# Patient Record
Sex: Female | Born: 1970 | Race: Asian | Hispanic: No | Marital: Married | Smoking: Never smoker
Health system: Southern US, Community
[De-identification: ages and names within clinical notes are randomized; demographics above are authoritative.]

## PROBLEM LIST (undated history)

## (undated) DIAGNOSIS — R350 Frequency of micturition: Secondary | ICD-10-CM

## (undated) DIAGNOSIS — F419 Anxiety disorder, unspecified: Secondary | ICD-10-CM

## (undated) DIAGNOSIS — R109 Unspecified abdominal pain: Secondary | ICD-10-CM

## (undated) DIAGNOSIS — J3089 Other allergic rhinitis: Secondary | ICD-10-CM

## (undated) DIAGNOSIS — D649 Anemia, unspecified: Secondary | ICD-10-CM

## (undated) DIAGNOSIS — N939 Abnormal uterine and vaginal bleeding, unspecified: Secondary | ICD-10-CM

---

## 2014-09-17 ENCOUNTER — Ambulatory Visit: Payer: Self-pay | Admitting: Internal Medicine

## 2015-10-10 ENCOUNTER — Other Ambulatory Visit: Payer: Self-pay | Admitting: Family Medicine

## 2015-10-10 DIAGNOSIS — N63 Unspecified lump in unspecified breast: Secondary | ICD-10-CM

## 2015-10-18 ENCOUNTER — Ambulatory Visit
Admission: RE | Admit: 2015-10-18 | Discharge: 2015-10-18 | Disposition: A | Discharge status: Home / Self Care | Payer: BLUE CROSS/BLUE SHIELD | Source: Ambulatory Visit | Attending: Family Medicine | Admitting: Family Medicine

## 2015-10-18 DIAGNOSIS — N63 Unspecified lump in unspecified breast: Secondary | ICD-10-CM

## 2015-10-22 ENCOUNTER — Other Ambulatory Visit: Payer: Self-pay | Admitting: Family Medicine

## 2015-10-22 DIAGNOSIS — N63 Unspecified lump in unspecified breast: Secondary | ICD-10-CM

## 2015-10-29 ENCOUNTER — Ambulatory Visit
Admission: RE | Admit: 2015-10-29 | Discharge: 2015-10-29 | Disposition: A | Discharge status: Home / Self Care | Payer: BLUE CROSS/BLUE SHIELD | Source: Ambulatory Visit | Attending: Family Medicine | Admitting: Family Medicine

## 2015-10-29 DIAGNOSIS — N6032 Fibrosclerosis of left breast: Secondary | ICD-10-CM | POA: Diagnosis not present

## 2015-10-29 DIAGNOSIS — N63 Unspecified lump in unspecified breast: Secondary | ICD-10-CM

## 2015-10-29 HISTORY — PX: BREAST BIOPSY: SHX20

## 2015-10-30 LAB — SURGICAL PATHOLOGY

## 2016-05-19 ENCOUNTER — Other Ambulatory Visit: Payer: Self-pay | Admitting: Obstetrics and Gynecology

## 2016-05-19 DIAGNOSIS — N6320 Unspecified lump in the left breast, unspecified quadrant: Secondary | ICD-10-CM

## 2016-06-02 ENCOUNTER — Ambulatory Visit
Admission: RE | Admit: 2016-06-02 | Discharge: 2016-06-02 | Disposition: A | Discharge status: Home / Self Care | Payer: BLUE CROSS/BLUE SHIELD | Source: Ambulatory Visit | Attending: Obstetrics and Gynecology | Admitting: Obstetrics and Gynecology

## 2016-06-02 DIAGNOSIS — N6320 Unspecified lump in the left breast, unspecified quadrant: Secondary | ICD-10-CM | POA: Insufficient documentation

## 2016-09-02 ENCOUNTER — Other Ambulatory Visit: Payer: Self-pay | Admitting: Internal Medicine

## 2016-09-02 DIAGNOSIS — Z1231 Encounter for screening mammogram for malignant neoplasm of breast: Secondary | ICD-10-CM

## 2016-11-02 ENCOUNTER — Ambulatory Visit
Admission: RE | Admit: 2016-11-02 | Discharge: 2016-11-02 | Disposition: A | Discharge status: Home / Self Care | Payer: BLUE CROSS/BLUE SHIELD | Source: Ambulatory Visit | Attending: Internal Medicine | Admitting: Internal Medicine

## 2016-11-02 DIAGNOSIS — Z1231 Encounter for screening mammogram for malignant neoplasm of breast: Secondary | ICD-10-CM | POA: Insufficient documentation

## 2016-11-04 ENCOUNTER — Other Ambulatory Visit: Payer: Self-pay | Admitting: Internal Medicine

## 2016-11-04 DIAGNOSIS — N6310 Unspecified lump in the right breast, unspecified quadrant: Secondary | ICD-10-CM

## 2016-11-04 DIAGNOSIS — R928 Other abnormal and inconclusive findings on diagnostic imaging of breast: Secondary | ICD-10-CM

## 2016-11-19 ENCOUNTER — Ambulatory Visit
Admission: RE | Admit: 2016-11-19 | Discharge: 2016-11-19 | Disposition: A | Discharge status: Home / Self Care | Payer: BLUE CROSS/BLUE SHIELD | Source: Ambulatory Visit | Attending: Internal Medicine | Admitting: Internal Medicine

## 2016-11-19 DIAGNOSIS — R928 Other abnormal and inconclusive findings on diagnostic imaging of breast: Secondary | ICD-10-CM | POA: Diagnosis present

## 2016-11-19 DIAGNOSIS — N6001 Solitary cyst of right breast: Secondary | ICD-10-CM | POA: Insufficient documentation

## 2016-11-19 DIAGNOSIS — N6310 Unspecified lump in the right breast, unspecified quadrant: Secondary | ICD-10-CM

## 2018-05-30 ENCOUNTER — Encounter
Admission: RE | Admit: 2018-05-30 | Discharge: 2018-05-30 | Disposition: A | Discharge status: Home / Self Care | Payer: BLUE CROSS/BLUE SHIELD | Source: Ambulatory Visit | Attending: Obstetrics & Gynecology | Admitting: Obstetrics & Gynecology

## 2018-05-30 ENCOUNTER — Other Ambulatory Visit: Payer: Self-pay

## 2018-05-30 DIAGNOSIS — Z01812 Encounter for preprocedural laboratory examination: Secondary | ICD-10-CM | POA: Diagnosis present

## 2018-05-30 HISTORY — DX: Anemia, unspecified: D64.9

## 2018-05-30 HISTORY — DX: Frequency of micturition: R35.0

## 2018-05-30 HISTORY — DX: Other allergic rhinitis: J30.89

## 2018-05-30 HISTORY — DX: Abnormal uterine and vaginal bleeding, unspecified: N93.9

## 2018-05-30 HISTORY — DX: Anxiety disorder, unspecified: F41.9

## 2018-05-30 HISTORY — DX: Unspecified abdominal pain: R10.9

## 2018-05-30 LAB — BASIC METABOLIC PANEL
Anion gap: 8 (ref 5–15)
BUN: 9 mg/dL (ref 6–20)
CALCIUM: 9.2 mg/dL (ref 8.9–10.3)
CO2: 24 mmol/L (ref 22–32)
CREATININE: 0.57 mg/dL (ref 0.44–1.00)
Chloride: 103 mmol/L (ref 98–111)
GFR calc Af Amer: >60 mL/min (ref >60)
GFR calc non Af Amer: >60 mL/min (ref >60)
Glucose, Bld: 97 mg/dL (ref 70–99)
Potassium: 3.5 mmol/L (ref 3.5–5.1)
Sodium: 135 mmol/L (ref 135–145)

## 2018-05-30 LAB — CBC
HEMATOCRIT: 41.5 % (ref 36.0–46.0)
Hemoglobin: 12.7 g/dL (ref 12.0–15.0)
MCH: 24.2 pg — ABNORMAL LOW (ref 26.0–34.0)
MCHC: 30.6 g/dL (ref 30.0–36.0)
MCV: 79 fL — AB (ref 80.0–100.0)
Platelets: 307 10*3/uL (ref 150–400)
RBC: 5.25 MIL/uL — ABNORMAL HIGH (ref 3.87–5.11)
RDW: 17.6 % — AB (ref 11.5–15.5)
WBC: 4.2 10*3/uL (ref 4.0–10.5)
nRBC: 0 % (ref 0.0–0.2)

## 2018-05-30 LAB — TYPE AND SCREEN
ABO/RH(D): B POS
Antibody Screen: NEGATIVE

## 2018-05-30 NOTE — Patient Instructions (Signed)
Your procedure is scheduled on: 06/03/18 Fri  Report to Same Day Surgery 2nd floor medical mall [facility] Entrance-take elevator on left to 2nd floor.  Check in with surgery information desk.) To find out your arrival time please call [phone] between 1PM - 3PM on 06/02/18 Thur Remember: Instructions that are not followed completely may result in serious medical risk, up to and including death, or upon the discretion of your surgeon and anesthesiologist your surgery may need to be rescheduled.    _x___ 1. Do not eat food after midnight the night before your procedure. You may drink clear liquids up to 2 hours before you are scheduled to arrive at the hospital for your procedure.  Do not drink clear liquids within 2 hours of your scheduled arrival to the hospital.  Clear liquids include  --Water or Apple juice without pulp  --Clear carbohydrate beverage such as ClearFast or Gatorade  --Black Coffee or Clear Tea (No milk, no creamers, do not add anything to                  the coffee or Tea Type 1 and type 2 diabetics should only drink water.   ____Ensure clear carbohydrate drink on the way to the hospital for bariatric patients  ____Ensure clear carbohydrate drink 3 hours before surgery for Dr Dwyane Luo patients if physician instructed.   No gum chewing or hard candies.     __x__ 2. No Alcohol for 24 hours before or after surgery.   __x__3. No Smoking or e-cigarettes for 24 prior to surgery.  Do not use any chewable tobacco products for at least 6 hour prior to surgery   ____  4. Bring all medications with you on the day of surgery if instructed.    __x__ 5. Notify your doctor if there is any change in your medical condition     (cold, fever, infections).    x___6. On the morning of surgery brush your teeth with toothpaste and water.  You may rinse your mouth with mouth wash if you wish.  Do not swallow any toothpaste or mouthwash.   Do not wear jewelry, make-up, hairpins,  clips or nail polish.  Do not wear lotions, powders, or perfumes. You may wear deodorant.  Do not shave 48 hours prior to surgery. Men may shave face and neck.  Do not bring valuables to the hospital.    [facility] is not responsible for any belongings or valuables.               Contacts, dentures or bridgework may not be worn into surgery.  Leave your suitcase in the car. After surgery it may be brought to your room.  For patients admitted to the hospital, discharge time is determined by your                       treatment team.  _  Patients discharged the day of surgery will not be allowed to drive home.  You will need someone to drive you home and stay with you the night of your procedure.    Please read over the following fact sheets that you were given:   [facility] Preparing for Surgery and or MRSA Information   _x___ Take anti-hypertensive listed below, cardiac, seizure, asthma,     anti-reflux and psychiatric medicines. These include:  1. levocetirizine (XYZAL) 5 MG tablet if needed  2.  3.  4.  5.  6.  ____Fleets  enema or Magnesium Citrate as directed.   _x___ Use CHG Soap or sage wipes as directed on instruction sheet   ____ Use inhalers on the day of surgery and bring to hospital day of surgery  ____ Stop Metformin and Janumet 2 days prior to surgery.    ____ Take 1/2 of usual insulin dose the night before surgery and none on the morning     surgery.   _x___ Follow recommendations from Cardiologist, Pulmonologist or PCP regarding          stopping Aspirin, Coumadin, Plavix ,Eliquis, Effient, or Pradaxa, and Pletal.  X____Stop Anti-inflammatories such as Advil, Aleve, Ibuprofen, Motrin, Naproxen, Naprosyn, Goodies powders or aspirin products. OK to take Tylenol and                          Celebrex.   _x___ Stop supplements until after surgery.  But may continue Vitamin D, Vitamin B,       and multivitamin.   ____ Bring C-Pap to the hospital.

## 2018-06-02 MED ORDER — CEFAZOLIN SODIUM-DEXTROSE 2-4 GM/100ML-% IV SOLN
2.0000 g | Freq: Once | INTRAVENOUS | Status: AC
  Administered 2018-06-03: 2 g via INTRAVENOUS

## 2018-06-03 ENCOUNTER — Ambulatory Visit: Payer: BLUE CROSS/BLUE SHIELD | Admitting: Anesthesiology

## 2018-06-03 ENCOUNTER — Ambulatory Visit
Admission: RE | Admit: 2018-06-03 | Discharge: 2018-06-03 | Disposition: A | Discharge status: Home / Self Care | Payer: BLUE CROSS/BLUE SHIELD | Source: Ambulatory Visit | Attending: Obstetrics & Gynecology | Admitting: Obstetrics & Gynecology

## 2018-06-03 ENCOUNTER — Other Ambulatory Visit: Payer: Self-pay

## 2018-06-03 ENCOUNTER — Encounter: Payer: Self-pay | Admitting: *Deleted

## 2018-06-03 ENCOUNTER — Encounter
Admission: RE | Disposition: A | Discharge status: Home / Self Care | Payer: Self-pay | Source: Ambulatory Visit | Attending: Obstetrics & Gynecology

## 2018-06-03 DIAGNOSIS — D259 Leiomyoma of uterus, unspecified: Secondary | ICD-10-CM

## 2018-06-03 DIAGNOSIS — J302 Other seasonal allergic rhinitis: Secondary | ICD-10-CM | POA: Diagnosis not present

## 2018-06-03 DIAGNOSIS — N800 Endometriosis of uterus: Secondary | ICD-10-CM | POA: Insufficient documentation

## 2018-06-03 DIAGNOSIS — N838 Other noninflammatory disorders of ovary, fallopian tube and broad ligament: Secondary | ICD-10-CM | POA: Insufficient documentation

## 2018-06-03 DIAGNOSIS — N939 Abnormal uterine and vaginal bleeding, unspecified: Secondary | ICD-10-CM | POA: Diagnosis not present

## 2018-06-03 DIAGNOSIS — D251 Intramural leiomyoma of uterus: Secondary | ICD-10-CM | POA: Diagnosis not present

## 2018-06-03 DIAGNOSIS — N888 Other specified noninflammatory disorders of cervix uteri: Secondary | ICD-10-CM | POA: Diagnosis not present

## 2018-06-03 DIAGNOSIS — K668 Other specified disorders of peritoneum: Secondary | ICD-10-CM | POA: Diagnosis not present

## 2018-06-03 HISTORY — PX: LAPAROSCOPIC HYSTERECTOMY: SHX1926

## 2018-06-03 HISTORY — PX: LAPAROSCOPIC BILATERAL SALPINGECTOMY: SHX5889

## 2018-06-03 LAB — ABO/RH: ABO/RH(D): B POS

## 2018-06-03 LAB — POCT PREGNANCY, URINE: Preg Test, Ur: NEGATIVE

## 2018-06-03 SURGERY — HYSTERECTOMY, TOTAL, LAPAROSCOPIC
Anesthesia: General

## 2018-06-03 MED ORDER — SUGAMMADEX SODIUM 200 MG/2ML IV SOLN
INTRAVENOUS | Status: DC | PRN
  Administered 2018-06-03: 150 mg via INTRAVENOUS

## 2018-06-03 MED ORDER — CEFAZOLIN SODIUM-DEXTROSE 2-4 GM/100ML-% IV SOLN
INTRAVENOUS | Status: AC
  Filled 2018-06-03: qty 100

## 2018-06-03 MED ORDER — ACETAMINOPHEN 500 MG PO TABS
1000.0000 mg | ORAL_TABLET | Freq: Once | ORAL | Status: AC
  Administered 2018-06-03: 1000 mg via ORAL

## 2018-06-03 MED ORDER — MIDAZOLAM HCL 2 MG/2ML IJ SOLN
INTRAMUSCULAR | Status: DC | PRN
  Administered 2018-06-03: 2 mg via INTRAVENOUS

## 2018-06-03 MED ORDER — LACTATED RINGERS IV SOLN
INTRAVENOUS | Status: DC | PRN
  Administered 2018-06-03: 13:00:00 via INTRAVENOUS

## 2018-06-03 MED ORDER — MIDAZOLAM HCL 2 MG/2ML IJ SOLN
INTRAMUSCULAR | Status: AC
  Filled 2018-06-03: qty 2

## 2018-06-03 MED ORDER — ROCURONIUM BROMIDE 100 MG/10ML IV SOLN
INTRAVENOUS | Status: DC | PRN
  Administered 2018-06-03: 20 mg via INTRAVENOUS

## 2018-06-03 MED ORDER — ACETAMINOPHEN 650 MG RE SUPP: 650.0000 mg | RECTAL | Status: DC | PRN

## 2018-06-03 MED ORDER — PROPOFOL 10 MG/ML IV BOLUS
INTRAVENOUS | Status: AC
  Filled 2018-06-03: qty 20

## 2018-06-03 MED ORDER — MORPHINE SULFATE (PF) 4 MG/ML IV SOLN: 1.0000 mg | INTRAVENOUS | Status: DC | PRN

## 2018-06-03 MED ORDER — IBUPROFEN 800 MG PO TABS: 800.0000 mg | ORAL_TABLET | Freq: Four times a day (QID) | ORAL | 1 refills | Status: AC

## 2018-06-03 MED ORDER — DEXAMETHASONE SODIUM PHOSPHATE 10 MG/ML IJ SOLN
INTRAMUSCULAR | Status: DC | PRN
  Administered 2018-06-03: 10 mg via INTRAVENOUS

## 2018-06-03 MED ORDER — CELECOXIB 200 MG PO CAPS
400.0000 mg | ORAL_CAPSULE | Freq: Once | ORAL | Status: AC
  Administered 2018-06-03: 400 mg via ORAL

## 2018-06-03 MED ORDER — BUPIVACAINE HCL (PF) 0.5 % IJ SOLN
INTRAMUSCULAR | Status: AC
  Filled 2018-06-03: qty 30

## 2018-06-03 MED ORDER — ACETAMINOPHEN 500 MG PO TABS
ORAL_TABLET | ORAL | Status: AC
  Administered 2018-06-03: 1000 mg via ORAL
  Filled 2018-06-03: qty 2

## 2018-06-03 MED ORDER — LACTATED RINGERS IV SOLN
INTRAVENOUS | Status: DC
  Administered 2018-06-03: 13:00:00 via INTRAVENOUS

## 2018-06-03 MED ORDER — ONDANSETRON HCL 4 MG/2ML IJ SOLN: 4.0000 mg | Freq: Once | INTRAMUSCULAR | Status: DC | PRN

## 2018-06-03 MED ORDER — OXYCODONE HCL 5 MG PO TABS: 5.0000 mg | ORAL_TABLET | Freq: Three times a day (TID) | ORAL | 0 refills | Status: AC | PRN

## 2018-06-03 MED ORDER — FENTANYL CITRATE (PF) 100 MCG/2ML IJ SOLN
INTRAMUSCULAR | Status: DC | PRN
  Administered 2018-06-03 (×2): 50 ug via INTRAVENOUS

## 2018-06-03 MED ORDER — HEPARIN SODIUM (PORCINE) 5000 UNIT/ML IJ SOLN
INTRAMUSCULAR | Status: AC
  Administered 2018-06-03: 5000 [IU] via SUBCUTANEOUS
  Filled 2018-06-03: qty 1

## 2018-06-03 MED ORDER — CELECOXIB 200 MG PO CAPS
ORAL_CAPSULE | ORAL | Status: AC
  Administered 2018-06-03: 400 mg via ORAL
  Filled 2018-06-03: qty 2

## 2018-06-03 MED ORDER — ACETAMINOPHEN 325 MG PO TABS: 650.0000 mg | ORAL_TABLET | ORAL | Status: DC | PRN

## 2018-06-03 MED ORDER — FAMOTIDINE 20 MG PO TABS
ORAL_TABLET | ORAL | Status: AC
  Administered 2018-06-03: 20 mg via ORAL
  Filled 2018-06-03: qty 1

## 2018-06-03 MED ORDER — FENTANYL CITRATE (PF) 100 MCG/2ML IJ SOLN: 25.0000 ug | INTRAMUSCULAR | Status: DC | PRN

## 2018-06-03 MED ORDER — EPHEDRINE SULFATE 50 MG/ML IJ SOLN
INTRAMUSCULAR | Status: DC | PRN
  Administered 2018-06-03: 10 mg via INTRAVENOUS

## 2018-06-03 MED ORDER — GABAPENTIN 300 MG PO CAPS
600.0000 mg | ORAL_CAPSULE | Freq: Once | ORAL | Status: AC
  Administered 2018-06-03: 600 mg via ORAL

## 2018-06-03 MED ORDER — KETOROLAC TROMETHAMINE 30 MG/ML IJ SOLN: 30.0000 mg | Freq: Four times a day (QID) | INTRAMUSCULAR | Status: DC

## 2018-06-03 MED ORDER — FAMOTIDINE 20 MG PO TABS
20.0000 mg | ORAL_TABLET | Freq: Once | ORAL | Status: AC
  Administered 2018-06-03: 20 mg via ORAL

## 2018-06-03 MED ORDER — GABAPENTIN 300 MG PO CAPS
ORAL_CAPSULE | ORAL | Status: AC
  Administered 2018-06-03: 600 mg via ORAL
  Filled 2018-06-03: qty 2

## 2018-06-03 MED ORDER — HEPARIN SODIUM (PORCINE) 5000 UNIT/ML IJ SOLN
5000.0000 [IU] | Freq: Once | INTRAMUSCULAR | Status: AC
  Administered 2018-06-03: 5000 [IU] via SUBCUTANEOUS

## 2018-06-03 MED ORDER — LACTATED RINGERS IV SOLN: INTRAVENOUS | Status: DC

## 2018-06-03 MED ORDER — FENTANYL CITRATE (PF) 100 MCG/2ML IJ SOLN
INTRAMUSCULAR | Status: AC
  Filled 2018-06-03: qty 2

## 2018-06-03 MED ORDER — ONDANSETRON HCL 4 MG/2ML IJ SOLN
INTRAMUSCULAR | Status: DC | PRN
  Administered 2018-06-03: 4 mg via INTRAVENOUS

## 2018-06-03 SURGICAL SUPPLY — 57 items
BACTOSHIELD CHG 4% 4OZ (MISCELLANEOUS) ×1
BAG URINE DRAINAGE (UROLOGICAL SUPPLIES) ×4 IMPLANT
BLADE SURG SZ11 CARB STEEL (BLADE) ×4 IMPLANT
CANISTER SUCT 1200ML W/VALVE (MISCELLANEOUS) ×4 IMPLANT
CATH FOLEY 2WAY  5CC 16FR (CATHETERS) ×2
CATH URTH 16FR FL 2W BLN LF (CATHETERS) ×2 IMPLANT
CHLORAPREP W/TINT 26ML (MISCELLANEOUS) ×8 IMPLANT
COUNTER NEEDLE 20/40 LG (NEEDLE) ×4 IMPLANT
COVER WAND RF STERILE (DRAPES) ×4 IMPLANT
DEFOGGER SCOPE WARMER CLEARIFY (MISCELLANEOUS) ×4 IMPLANT
DERMABOND ADVANCED (GAUZE/BANDAGES/DRESSINGS) ×2
DERMABOND ADVANCED .7 DNX12 (GAUZE/BANDAGES/DRESSINGS) ×2 IMPLANT
DEVICE SUTURE ENDOST 10MM (ENDOMECHANICALS) IMPLANT
DRAPE LEGGINS SURG 28X43 STRL (DRAPES) ×4 IMPLANT
DRAPE SHEET LG 3/4 BI-LAMINATE (DRAPES) ×4 IMPLANT
DRAPE UNDER BUTTOCK W/FLU (DRAPES) ×4 IMPLANT
ELECT REM PT RETURN 9FT ADLT (ELECTROSURGICAL) ×4
ELECTRODE REM PT RTRN 9FT ADLT (ELECTROSURGICAL) ×2 IMPLANT
GLOVE PI ORTHOPRO 6.5 (GLOVE) ×2
GLOVE PI ORTHOPRO STRL 6.5 (GLOVE) ×2 IMPLANT
GLOVE SURG SYN 6.5 ES PF (GLOVE) ×24 IMPLANT
GOWN STRL REUS W/ TWL LRG LVL3 (GOWN DISPOSABLE) ×6 IMPLANT
GOWN STRL REUS W/ TWL XL LVL3 (GOWN DISPOSABLE) ×2 IMPLANT
GOWN STRL REUS W/TWL LRG LVL3 (GOWN DISPOSABLE) ×6
GOWN STRL REUS W/TWL XL LVL3 (GOWN DISPOSABLE) ×2
IRRIGATION STRYKERFLOW (MISCELLANEOUS) IMPLANT
IRRIGATOR STRYKERFLOW (MISCELLANEOUS)
IV LACTATED RINGERS 1000ML (IV SOLUTION) ×4 IMPLANT
KIT PINK PAD W/HEAD ARE REST (MISCELLANEOUS) ×4
KIT PINK PAD W/HEAD ARM REST (MISCELLANEOUS) ×2 IMPLANT
KIT TURNOVER CYSTO (KITS) ×4 IMPLANT
L-HOOK LAP DISP 36CM (ELECTROSURGICAL) ×4
LABEL OR SOLS (LABEL) IMPLANT
LHOOK LAP DISP 36CM (ELECTROSURGICAL) ×2 IMPLANT
LIGASURE VESSEL 5MM BLUNT TIP (ELECTROSURGICAL) ×4 IMPLANT
MANIPULATOR VCARE LG CRV RETR (MISCELLANEOUS) ×4 IMPLANT
MANIPULATOR VCARE SML CRV RETR (MISCELLANEOUS) IMPLANT
MANIPULATOR VCARE STD CRV RETR (MISCELLANEOUS) IMPLANT
NS IRRIG 500ML POUR BTL (IV SOLUTION) ×4 IMPLANT
PACK LAP CHOLECYSTECTOMY (MISCELLANEOUS) ×4 IMPLANT
PAD OB MATERNITY 4.3X12.25 (PERSONAL CARE ITEMS) ×4 IMPLANT
PAD PREP 24X41 OB/GYN DISP (PERSONAL CARE ITEMS) ×4 IMPLANT
PENCIL ELECTRO HAND CTR (MISCELLANEOUS) ×4 IMPLANT
SCRUB CHG 4% DYNA-HEX 4OZ (MISCELLANEOUS) ×3 IMPLANT
SET CYSTO W/LG BORE CLAMP LF (SET/KITS/TRAYS/PACK) IMPLANT
SET YANKAUER POOLE SUCT (MISCELLANEOUS) ×8 IMPLANT
SLEEVE ENDOPATH XCEL 5M (ENDOMECHANICALS) ×12 IMPLANT
SURGILUBE 2OZ TUBE FLIPTOP (MISCELLANEOUS) ×4 IMPLANT
SUT ENDO VLOC 180-0-8IN (SUTURE) IMPLANT
SUT MNCRL 4-0 (SUTURE) ×2
SUT MNCRL 4-0 27XMFL (SUTURE) ×2
SUT MNCRL AB 4-0 PS2 18 (SUTURE) IMPLANT
SUT VIC AB 0 CT1 36 (SUTURE) ×8 IMPLANT
SUTURE MNCRL 4-0 27XMF (SUTURE) ×2 IMPLANT
TROCAR XCEL NON-BLD 5MMX100MML (ENDOMECHANICALS) ×4 IMPLANT
TUBING INSUF HEATED (TUBING) ×4 IMPLANT
TUBING INSUFFLATION (TUBING) ×4 IMPLANT

## 2018-06-03 NOTE — Anesthesia Post-op Follow-up Note (Signed)
Anesthesia QCDR form completed.        

## 2018-06-03 NOTE — Anesthesia Procedure Notes (Signed)
Procedure Name: Intubation Date/Time: 06/03/2018 1:24 PM Performed by: Timoteo Expose, CRNA Pre-anesthesia Checklist: Patient identified, Emergency Drugs available, Suction available, Patient being monitored and Timeout performed Patient Re-evaluated:Patient Re-evaluated prior to induction Oxygen Delivery Method: Circle system utilized Preoxygenation: Pre-oxygenation with 100% oxygen Induction Type: IV induction Ventilation: Mask ventilation without difficulty Laryngoscope Size: Mac and 3 Grade View: Grade I Tube type: Oral Tube size: 6.5 mm Number of attempts: 1 Airway Equipment and Method: Stylet Placement Confirmation: ETT inserted through vocal cords under direct vision Secured at: 21 cm Tube secured with: Tape Dental Injury: Teeth and Oropharynx as per pre-operative assessment  Future Recommendations: Recommend- induction with short-acting agent, and alternative techniques readily available

## 2018-06-03 NOTE — Anesthesia Preprocedure Evaluation (Signed)
Anesthesia Evaluation  Patient identified by MRN, date of birth, ID band Patient awake    Reviewed: Allergy & Precautions, H&P , NPO status , Patient's Chart, lab work & pertinent test results, reviewed documented beta blocker date and time   Airway Mallampati: II  TM Distance: >3 FB Neck ROM: full    Dental  (+) Teeth Intact   Pulmonary neg pulmonary ROS,    Pulmonary exam normal        Cardiovascular Exercise Tolerance: Good negative cardio ROS Normal cardiovascular exam Rhythm:regular Rate:Normal     Neuro/Psych Anxiety negative neurological ROS  negative psych ROS   GI/Hepatic negative GI ROS, Neg liver ROS,   Endo/Other  negative endocrine ROS  Renal/GU negative Renal ROS  negative genitourinary   Musculoskeletal   Abdominal   Peds  Hematology  (+) Blood dyscrasia, anemia ,   Anesthesia Other Findings Past Medical History: No date: Abdominal pain No date: Abnormal uterine bleeding No date: Anemia No date: Anxiety No date: Environmental and seasonal allergies No date: Urinary frequency Past Surgical History: 10/29/2015: BREAST BIOPSY; Left     Comment:  FOAMY MACROPHAGES, ACUTE AND CHRONIC DUCTAL               INFLAMMATION, AND REACTIVE    Reproductive/Obstetrics negative OB ROS                             Anesthesia Physical Anesthesia Plan  ASA: II  Anesthesia Plan: General ETT   Post-op Pain Management:    Induction:   PONV Risk Score and Plan:   Airway Management Planned:   Additional Equipment:   Intra-op Plan:   Post-operative Plan:   Informed Consent: I have reviewed the patients History and Physical, chart, labs and discussed the procedure including the risks, benefits and alternatives for the proposed anesthesia with the patient or authorized representative who has indicated his/her understanding and acceptance.   Dental Advisory Given  Plan Discussed  with: CRNA  Anesthesia Plan Comments:         Anesthesia Quick Evaluation

## 2018-06-03 NOTE — Op Note (Signed)
Total Laparoscopic Hysterectomy Operative Note Procedure Date: 06/03/2018  Patient:  Stephanie Goodman  47 y.o. female  PRE-OPERATIVE DIAGNOSIS:  Symptomatic fibroid uterus, abnormal bleeding  POST-OPERATIVE DIAGNOSIS:same, pelvic inclusion cyst  PROCEDURE:  Procedure(s): HYSTERECTOMY TOTAL LAPAROSCOPIC (N/A) LAPAROSCOPIC BILATERAL SALPINGECTOMY (Bilateral)  SURGEON:  Surgeon(s) and Role:    * Carinne Brandenburger, Honor Loh, MD - Primary    * Benjaman Kindler, MD - Assisting  ANESTHESIA:  General via ET  I/O  Total I/O In: 700 [I.V.:700] Out: 750 [Urine:700; Blood:50]  FINDINGS:  Large smooth intramural fibroid uterus, normal ovaries bilaterally and normal left fallopian tube.  Normal upper abdomen.  Free-floating cystic structure in pelvic posterior culdesac  SPECIMEN: Uterus, Cervix, and left fallopian tube, inclusion cyst  COMPLICATIONS: none apparent  DISPOSITION: vital signs stable to PACU  Indication for Surgery: 47 y.o. with known fibroid uterus and increasing symptoms, requesting definitive management for her constellation of symptoms.  Risks of surgery were discussed with the patient including but not limited to: bleeding which may require transfusion or reoperation; infection which may require antibiotics; injury to bowel, bladder, ureters or other surrounding organs; need for additional procedures including laparotomy, blood clot, incisional problems and other postoperative/anesthesia complications. Written informed consent was obtained.      PROCEDURE IN DETAIL:  The patient had 5000u Heparin Sub-q and sequential compression devices applied to her lower extremities while in the preoperative area.  She was then taken to the operating room. IV antibiotics were given. General anesthesia was administered via endotracheal route.  She was placed in the dorsal lithotomy position, and was prepped and draped in a sterile manner. A surgical time-out was performed.  A Foley catheter was inserted into  her bladder and attached to constant drainage and a V-Care uterine manipulator was then advanced into the uterus and a good fit around the cervix was noted. The gloves were changed, and attention was turned to the abdomen where an umbilical incision was made with the scalpel.  A 1mm trochar was inserted in the umbilical incision using a visiport method.Opening pressure was 84mmHg, and the abdomen was insufflated to 68mmHg carbon dioxide gas and adequate pneumoperitoneum was obtained. A survey of the patient's pelvis and abdomen revealed the findings as mentioned above. Two 55mm ports were inserted in the lower left and right quadrants under visualization.    The right fallopian tube was absent.  The left fallopian tube was separated from the mesosalpinx using the Ligasure. The bilateral round ligaments and utero-ovarian ligaments and vessels were transected and anterior broad ligament divided and brought across the uterus to separate the vesicouterine peritoneum and create a bladder flap. The bladder was pushed away from the uterus. The bilateral uterine arteries were skeletonized, ligated and transected. The bilateral uterosacral and cardinal ligaments were ligated and transected. A colpotomy was made around the V-Care cervical cup and the uterus, cervix, and bilateral tubes were removed through the vagina.The incidental inclusion cyst found in the pelvic was also removed. The vaginal cuff was closed vaginally using 0-Vicryl in a running locking stitch. This was tested for integrity using the surgeon's finger. After a change of gloves, the pneumoperitoneum was recreated and surgical site inspected, and found to be hemostatic. Bilateral ureters were visualized vermiuclating. No intraoperative injury to surrounding organs was noted. The abdomen was desufflated and all instruments were then removed.   All skin incisions were closed with 4-0 monocryl and covered with surgical glue. The patient tolerated  the procedures well.  All instruments, needles, and sponge  counts were correct x 2. The patient was taken to the recovery room in stable condition.   ---- Larey Days, MD Attending Obstetrician and [facility]

## 2018-06-03 NOTE — Discharge Instructions (Addendum)
Discharge instructions:  Call office if you have any of the following: fever >101 F, chills, shortness of breath, excessive vaginal bleeding, incision drainage or problems, leg pain or redness, or any other concerns.   Activity: Do not lift > 15 lbs for 8 weeks.  No intercourse or tampons for 8 weeks.  No driving for 1-2 weeks, or until you are certain you can slam on the brakes.   You may feel some pain in your upper right abdomen/rib and right shoulder.  This is from the gas in the abdomen for surgery. This will subside over time, please be patient!  Take 800mg  Ibuprofen and 1000mg  Tylenol around the clock, every 6 hours for at least the first 3-5 days.  After this you can take as needed.  This will help decrease inflammation and promote healing.  The narcotics you'll take just as needed, as they just trick your brain into thinking its not in pain.    Please don't limit yourself in terms of routine activity.  You will be able to do most things, although they may take longer to do or be a little painful.  You can do it!  Don't be a hero, but don't be a wimp either!     AMBULATORY SURGERY  DISCHARGE INSTRUCTIONS   1) The drugs that you were given will stay in your system until tomorrow so for the next 24 hours you should not:  A) Drive an automobile B) Make any legal decisions C) Drink any alcoholic beverage   2) You may resume regular meals tomorrow.  Today it is better to start with liquids and gradually work up to solid foods.  You may eat anything you prefer, but it is better to start with liquids, then soup and crackers, and gradually work up to solid foods.   3) Please notify your doctor immediately if you have any unusual bleeding, trouble breathing, redness and pain at the surgery site, drainage, fever, or pain not relieved by medication.    4) Additional Instructions:        Please contact your physician with any problems or Same Day Surgery at [phone],  Monday through Friday 6 am to 4 pm, or [place] at [facility] number at [phone].

## 2018-06-03 NOTE — Transfer of Care (Signed)
Immediate Anesthesia Transfer of Care Note  Patient: Stephanie Goodman  Procedure(s) Performed: HYSTERECTOMY TOTAL LAPAROSCOPIC (N/A ) LAPAROSCOPIC BILATERAL SALPINGECTOMY (Bilateral )  Patient Location: PACU  Anesthesia Type:General  Level of Consciousness: awake  Airway & Oxygen Therapy: Patient Spontanous Breathing  Post-op Assessment: Report given to RN  Post vital signs: Reviewed  Last Vitals:  Vitals Value Taken Time  BP 111/79 06/03/2018  2:55 PM  Temp    Pulse 81 06/03/2018  2:57 PM  Resp 19 06/03/2018  2:57 PM  SpO2 100 % 06/03/2018  2:57 PM  Vitals shown include unvalidated device data.  Last Pain:  Vitals:   06/03/18 1202  TempSrc: Tympanic  PainSc: 2       Patients Stated Pain Goal: 2 (37/36/68 1594)  Complications: No apparent anesthesia complications

## 2018-06-03 NOTE — H&P (Signed)
Preoperative History and Physical  Stephanie Goodman is a 47 y.o.here for surgical management of symptomatic fibroid uterus.  Has pelvic pressure and urinary frequency due to mass effect, as well as history of anemia secondary to heavy menses.   No significant preoperative concerns.  Proposed surgery: total laparoscopic hysterectomy, bilateral salpingectomy  Past Medical History:  Diagnosis Date  . Abdominal pain   . Abnormal uterine bleeding   . Anemia   . Anxiety   . Environmental and seasonal allergies   . Urinary frequency    Past Surgical History:  Procedure Laterality Date  . BREAST BIOPSY Left 10/29/2015   FOAMY MACROPHAGES, ACUTE AND CHRONIC DUCTAL INFLAMMATION, AND REACTIVE    OB History  No obstetric history on file.  Patient denies any other pertinent gynecologic issues.   No current facility-administered medications on file prior to encounter.    Current Outpatient Medications on File Prior to Encounter  Medication Sig Dispense Refill  . levocetirizine (XYZAL) 5 MG tablet Take 5 mg by mouth at bedtime as needed.   4   No Known Allergies  Social History:   reports that she has never smoked. She has never used smokeless tobacco. She reports that she does not drink alcohol or use drugs.  Family History  Problem Relation Age of Onset  . Breast cancer Neg Hx     Review of Systems: Noncontributory  PHYSICAL EXAM: Blood pressure 104/75, pulse 74, temperature (!) 97.3 F (36.3 C), temperature source Tympanic, resp. rate 16, last menstrual period 05/26/2018, SpO2 100 %. General appearance - alert, well appearing, and in no distress Chest - clear to auscultation, no wheezes, rales or rhonchi, symmetric air entry Heart - normal rate and regular rhythm Abdomen - soft, nontender, nondistended, no masses or organomegaly Pelvic - examination not indicated Extremities - peripheral pulses normal, no pedal edema, no clubbing or cyanosis  Labs: Results for orders placed or performed  during the hospital encounter of 06/03/18 (from the past 336 hour(s))  Pregnancy, urine POC   Collection Time: 06/03/18 12:07 PM  Result Value Ref Range   Preg Test, Ur NEGATIVE NEGATIVE  Results for orders placed or performed during the hospital encounter of 05/30/18 (from the past 336 hour(s))  CBC   Collection Time: 05/30/18  1:59 PM  Result Value Ref Range   WBC 4.2 4.0 - 10.5 K/uL   RBC 5.25 (H) 3.87 - 5.11 MIL/uL   Hemoglobin 12.7 12.0 - 15.0 g/dL   HCT 41.5 36.0 - 46.0 %   MCV 79.0 (L) 80.0 - 100.0 fL   MCH 24.2 (L) 26.0 - 34.0 pg   MCHC 30.6 30.0 - 36.0 g/dL   RDW 17.6 (H) 11.5 - 15.5 %   Platelets 307 150 - 400 K/uL   nRBC 0.0 0.0 - 0.2 %  Basic metabolic panel   Collection Time: 05/30/18  1:59 PM  Result Value Ref Range   Sodium 135 135 - 145 mmol/L   Potassium 3.5 3.5 - 5.1 mmol/L   Chloride 103 98 - 111 mmol/L   CO2 24 22 - 32 mmol/L   Glucose, Bld 97 70 - 99 mg/dL   BUN 9 6 - 20 mg/dL   Creatinine, Ser 0.57 0.44 - 1.00 mg/dL   Calcium 9.2 8.9 - 10.3 mg/dL   GFR calc non Af Amer >60 >60 mL/min   GFR calc Af Amer >60 >60 mL/min   Anion gap 8 5 - 15  Type and screen [place]  Collection Time: 05/30/18  1:59 PM  Result Value Ref Range   ABO/RH(D) B POS    Antibody Screen NEG    Sample Expiration 06/13/2018    Extend sample reason      NO TRANSFUSIONS OR PREGNANCY IN THE PAST 3 MONTHS Performed at [facility], [address] [place],  [address]     Imaging Studies: done in office 10 x 9 x 9cm uterus with prominent 7cm fibroid  Assessment: Patient Active Problem List   Diagnosis Date Noted  . Fibroid uterus 06/03/2018    Plan: Patient will undergo surgical management with [facility] BS.  The risks of surgery were discussed in detail with the patient including but not limited to: bleeding which may require transfusion or reoperation; infection which may require antibiotics; injury to surrounding organs which may involve  bowel, bladder, ureters ; need for additional procedures including laparoscopy or laparotomy; thromboembolic phenomenon, surgical site problems and other postoperative/anesthesia complications. Likelihood of success in alleviating the patient's condition was discussed. Routine postoperative instructions will be reviewed with the patient and her family in detail after surgery.  The patient concurred with the proposed plan, giving informed written consent for the surgery.  Patient has been NPO since last night she will remain NPO for procedure.  Anesthesia and OR aware.  Preoperative prophylactic antibiotics and SCDs ordered on call to the OR.  To OR when ready.  ----- Larey Days, MD Attending Obstetrician and Gynecologist [facility], Department of [facility]

## 2018-06-05 ENCOUNTER — Encounter: Payer: Self-pay | Admitting: Obstetrics & Gynecology

## 2018-06-06 NOTE — Anesthesia Postprocedure Evaluation (Signed)
Anesthesia Post Note  Patient: Stephanie Goodman  Procedure(s) Performed: HYSTERECTOMY TOTAL LAPAROSCOPIC (N/A ) LAPAROSCOPIC BILATERAL SALPINGECTOMY (Bilateral )  Patient location during evaluation: PACU Anesthesia Type: General Level of consciousness: awake and alert Pain management: pain level controlled Vital Signs Assessment: post-procedure vital signs reviewed and stable Respiratory status: spontaneous breathing, nonlabored ventilation, respiratory function stable and patient connected to nasal cannula oxygen Cardiovascular status: blood pressure returned to baseline and stable Postop Assessment: no apparent nausea or vomiting Anesthetic complications: no     Last Vitals:  Vitals:   06/03/18 1603 06/03/18 1714  BP: 118/70 114/73  Pulse: 78 83  Resp: 16 16  Temp: 36.9 C   SpO2: 100% 100%    Last Pain:  Vitals:   06/03/18 1714  TempSrc:   PainSc: 4                  Molli Barrows

## 2018-06-07 LAB — SURGICAL PATHOLOGY

## 2019-05-02 ENCOUNTER — Other Ambulatory Visit: Payer: Self-pay | Admitting: Internal Medicine

## 2019-05-02 DIAGNOSIS — N63 Unspecified lump in unspecified breast: Secondary | ICD-10-CM

## 2019-05-11 ENCOUNTER — Other Ambulatory Visit: Payer: BLUE CROSS/BLUE SHIELD

## 2019-05-22 ENCOUNTER — Ambulatory Visit
Admission: RE | Admit: 2019-05-22 | Discharge: 2019-05-22 | Disposition: A | Discharge status: Home / Self Care | Payer: BLUE CROSS/BLUE SHIELD | Source: Ambulatory Visit | Attending: Internal Medicine | Admitting: Internal Medicine

## 2019-05-22 ENCOUNTER — Encounter: Payer: Self-pay | Admitting: Radiology

## 2019-05-22 DIAGNOSIS — N63 Unspecified lump in unspecified breast: Secondary | ICD-10-CM | POA: Diagnosis present

## 2019-06-08 ENCOUNTER — Other Ambulatory Visit: Payer: Self-pay

## 2020-04-04 ENCOUNTER — Other Ambulatory Visit: Payer: Self-pay | Admitting: Physician Assistant

## 2020-04-04 DIAGNOSIS — D171 Benign lipomatous neoplasm of skin and subcutaneous tissue of trunk: Secondary | ICD-10-CM

## 2020-04-10 ENCOUNTER — Other Ambulatory Visit: Payer: Self-pay

## 2020-04-10 ENCOUNTER — Encounter (INDEPENDENT_AMBULATORY_CARE_PROVIDER_SITE_OTHER): Payer: Self-pay

## 2020-04-10 ENCOUNTER — Ambulatory Visit
Admission: RE | Admit: 2020-04-10 | Discharge: 2020-04-10 | Disposition: A | Discharge status: Home / Self Care | Payer: 59 | Source: Ambulatory Visit | Attending: Physician Assistant | Admitting: Physician Assistant

## 2020-04-10 DIAGNOSIS — D171 Benign lipomatous neoplasm of skin and subcutaneous tissue of trunk: Secondary | ICD-10-CM | POA: Diagnosis not present

## 2020-04-29 ENCOUNTER — Other Ambulatory Visit: Payer: Self-pay | Admitting: Internal Medicine

## 2020-04-29 DIAGNOSIS — Z1231 Encounter for screening mammogram for malignant neoplasm of breast: Secondary | ICD-10-CM

## 2020-06-12 ENCOUNTER — Ambulatory Visit
Admission: RE | Admit: 2020-06-12 | Discharge: 2020-06-12 | Disposition: A | Discharge status: Home / Self Care | Payer: 59 | Source: Ambulatory Visit | Attending: Internal Medicine | Admitting: Internal Medicine

## 2020-06-12 ENCOUNTER — Other Ambulatory Visit: Payer: Self-pay

## 2020-06-12 DIAGNOSIS — Z1231 Encounter for screening mammogram for malignant neoplasm of breast: Secondary | ICD-10-CM | POA: Diagnosis not present

## 2021-08-22 ENCOUNTER — Other Ambulatory Visit: Payer: Self-pay

## 2021-08-22 ENCOUNTER — Emergency Department: Payer: 59

## 2021-08-22 ENCOUNTER — Encounter: Payer: Self-pay | Admitting: Emergency Medicine

## 2021-08-22 DIAGNOSIS — R6883 Chills (without fever): Secondary | ICD-10-CM | POA: Insufficient documentation

## 2021-08-22 DIAGNOSIS — Z20822 Contact with and (suspected) exposure to covid-19: Secondary | ICD-10-CM | POA: Diagnosis not present

## 2021-08-22 DIAGNOSIS — R059 Cough, unspecified: Secondary | ICD-10-CM | POA: Diagnosis present

## 2021-08-22 DIAGNOSIS — R0602 Shortness of breath: Secondary | ICD-10-CM | POA: Diagnosis not present

## 2021-08-22 LAB — CBC
HCT: 41.1 % (ref 36.0–46.0)
Hemoglobin: 13.4 g/dL (ref 12.0–15.0)
MCH: 27.7 pg (ref 26.0–34.0)
MCHC: 32.6 g/dL (ref 30.0–36.0)
MCV: 84.9 fL (ref 80.0–100.0)
Platelets: 257 10*3/uL (ref 150–400)
RBC: 4.84 MIL/uL (ref 3.87–5.11)
RDW: 12.8 % (ref 11.5–15.5)
WBC: 6.2 10*3/uL (ref 4.0–10.5)
nRBC: 0 % (ref 0.0–0.2)

## 2021-08-22 LAB — BASIC METABOLIC PANEL
Anion gap: 10 (ref 5–15)
BUN: 10 mg/dL (ref 6–20)
CO2: 26 mmol/L (ref 22–32)
Calcium: 9.9 mg/dL (ref 8.9–10.3)
Chloride: 103 mmol/L (ref 98–111)
Creatinine, Ser: 0.59 mg/dL (ref 0.44–1.00)
GFR, Estimated: >60 mL/min (ref >60)
Glucose, Bld: 103 mg/dL — ABNORMAL HIGH (ref 70–99)
Potassium: 3.5 mmol/L (ref 3.5–5.1)
Sodium: 139 mmol/L (ref 135–145)

## 2021-08-22 LAB — RESP PANEL BY RT-PCR (FLU A&B, COVID) ARPGX2
Influenza A by PCR: NEGATIVE
Influenza B by PCR: NEGATIVE
SARS Coronavirus 2 by RT PCR: NEGATIVE

## 2021-08-22 NOTE — ED Triage Notes (Signed)
Pt in via POV, reports worsening chills, shortness of breath x 1 day.  States she was able to work today but was very fatigued.  Vitals WDL, pursed lip breathing noted in triage.  Denies any hx of Asthma or COPD.    ?

## 2021-08-23 ENCOUNTER — Emergency Department
Admission: EM | Admit: 2021-08-23 | Discharge: 2021-08-23 | Disposition: A | Discharge status: Home / Self Care | Payer: 59 | Attending: Emergency Medicine | Admitting: Emergency Medicine

## 2021-08-23 DIAGNOSIS — R0602 Shortness of breath: Secondary | ICD-10-CM

## 2021-08-23 LAB — TROPONIN I (HIGH SENSITIVITY)
Troponin I (High Sensitivity): <2 ng/L (ref <18)
Troponin I (High Sensitivity): 3 ng/L (ref <18)

## 2021-08-23 LAB — BRAIN NATRIURETIC PEPTIDE: B Natriuretic Peptide: 6.2 pg/mL (ref 0.0–100.0)

## 2021-08-23 LAB — D-DIMER, QUANTITATIVE: D-Dimer, Quant: 0.35 ug/mL-FEU (ref 0.00–0.50)

## 2021-08-23 NOTE — ED Provider Notes (Signed)
? ?[facility] ?Provider Note ? ? ? Event Date/Time  ? First MD Initiated Contact with Patient 08/23/21 0239   ?  (approximate) ? ? ?History  ? ?Shortness of Breath ? ? ?HPI ? ?Stephanie Goodman is a 51 y.o. female with history of seasonal allergies who presents for evaluation of shortness of breath.  Patient reports that she has had a mild cough and congestion for several weeks.  Had an x-ray done by her PCP which was negative.  Yesterday evening she was cleaning her room when she became acutely short of breath.  She denies wheezing or chest tightness or chest pain.  She has a family member to bring her to the hospital to be evaluated.  While in the waiting room she reports that she was given a soda and was able to let go a loud burp and after that her shortness of breath fully resolved.  She denies history of smoking, asthma or COPD.  No personal or family history.  DVT, no recent immobilization, no leg pain or swelling, no hemoptysis or exogenous hormones.  She recently came from [place] 4 weeks ago.  She has had some chills today as well but denies body aches, fever. ?  ? ? ?Past Medical History:  ?Diagnosis Date  ? Abdominal pain   ? Abnormal uterine bleeding   ? Anemia   ? Anxiety   ? Environmental and seasonal allergies   ? Urinary frequency   ? ? ?Past Surgical History:  ?Procedure Laterality Date  ? BREAST BIOPSY Left 10/29/2015  ? FOAMY MACROPHAGES, ACUTE AND CHRONIC DUCTAL INFLAMMATION, AND REACTIVE   ? LAPAROSCOPIC BILATERAL SALPINGECTOMY Bilateral 06/03/2018  ? Procedure: LAPAROSCOPIC BILATERAL SALPINGECTOMY;  Surgeon: Ward, Honor Loh, MD;  Location: [facility] ORS;  Service: Gynecology;  Laterality: Bilateral;  ? LAPAROSCOPIC HYSTERECTOMY N/A 06/03/2018  ? Procedure: HYSTERECTOMY TOTAL LAPAROSCOPIC;  Surgeon: Ward, Honor Loh, MD;  Location: [facility] ORS;  Service: Gynecology;  Laterality: N/A;  ? ? ? ?Physical Exam  ? ?Triage Vital Signs: ?ED Triage Vitals  ?Enc Vitals Group  ?   BP 08/22/21 2255  136/85  ?   Pulse Rate 08/22/21 2255 76  ?   Resp 08/22/21 2255 19  ?   Temp 08/22/21 2255 98.1 ?F (36.7 ?C)  ?   Temp Source 08/22/21 2255 Oral  ?   SpO2 08/22/21 2254 100 %  ?   Weight 08/22/21 2255 118 lb (53.5 kg)  ?   Height 08/22/21 2255 5' (1.524 m)  ?   Head Circumference --   ?   Peak Flow --   ?   Pain Score 08/22/21 2255 0  ?   Pain Loc --   ?   Pain Edu? --   ?   Excl. in [place]? --   ? ? ?Most recent vital signs: ?Vitals:  ? 08/23/21 0300 08/23/21 0330  ?BP: 112/71 129/81  ?Pulse: 69 79  ?Resp: 13 15  ?Temp:    ?SpO2: 100% 100%  ? ? ? ?Constitutional: Alert and oriented. Well appearing and in no apparent distress. ?HEENT: ?     Head: Normocephalic and atraumatic.    ?     Eyes: Conjunctivae are normal. Sclera is non-icteric.  ?     Mouth/Throat: Mucous membranes are moist.  ?     Neck: Supple with no signs of meningismus. ?Cardiovascular: Regular rate and rhythm. No murmurs, gallops, or rubs. 2+ symmetrical distal pulses are present in all extremities.  ?Respiratory: Normal  respiratory effort. Lungs are clear to auscultation bilaterally.  ?Gastrointestinal: Soft, non tender, and non distended with positive bowel sounds. No rebound or guarding. ?Genitourinary: No CVA tenderness. ?Musculoskeletal:  No edema, cyanosis, or erythema of extremities. ?Neurologic: Normal speech and language. Face is symmetric. Moving all extremities. No gross focal neurologic deficits are appreciated. ?Skin: Skin is warm, dry and intact. No rash noted. ?Psychiatric: Mood and affect are normal. Speech and behavior are normal. ? ?ED Results / Procedures / Treatments  ? ?Labs ?(all labs ordered are listed, but only abnormal results are displayed) ?Labs Reviewed  ?BASIC METABOLIC PANEL - Abnormal; Notable for the following components:  ?    Result Value  ? Glucose, Bld 103 (*)   ? All other components within normal limits  ?RESP PANEL BY RT-PCR (FLU A&B, COVID) ARPGX2  ?CBC  ?BRAIN NATRIURETIC PEPTIDE  ?D-DIMER, QUANTITATIVE  ?TROPONIN  I (HIGH SENSITIVITY)  ?TROPONIN I (HIGH SENSITIVITY)  ? ? ? ?EKG ? ?ED ECG REPORT ?I, Rudene Re, the attending physician, personally viewed and interpreted this ECG. ? ?Sinus rhythm with a rate of 83, normal intervals, normal axis, diffuse ST flattening, no ST elevations or depressions ? ?RADIOLOGY ?I, Rudene Re, attending MD, have personally viewed and interpreted the images obtained during this visit as below: ? ?Chest x-ray with no acute abnormality ? ? ?___________________________________________________ ?Interpretation by Radiologist:  ?DG Chest 2 View ? ?Result Date: 08/22/2021 ?CLINICAL DATA:  Acute onset shortness of breath and chills for 1 day EXAM: CHEST - 2 VIEW COMPARISON:  None. FINDINGS: Frontal and lateral views of the chest demonstrate an unremarkable cardiac silhouette. No airspace disease, effusion, or pneumothorax. No acute bony abnormalities. IMPRESSION: 1. No acute intrathoracic process. Electronically Signed   By: Randa Ngo M.D.   On: 08/22/2021 23:18   ? ? ? ?PROCEDURES: ? ?Critical Care performed: No ? ?Procedures ? ? ? ?IMPRESSION / MDM / ASSESSMENT AND PLAN / ED COURSE  ?I reviewed the triage vital signs and the nursing notes. ? ?51 y.o. female with history of seasonal allergies who presents for evaluation of shortness of breath.  Patient with a couple of weeks of congestion and mild dry cough.  This evening while cleaning her room became acutely short of breath.  After drinking soda and burping her symptoms resolved.  She is extremely well-appearing in no distress with normal vital signs, normal work of breathing normal sats, lungs are clear to auscultation with great air movement, heart regular rate and rhythm, there is no asymmetric leg swelling. ? ?Ddx: Gas versus bronchitis versus bronchospasm versus ACS versus pneumonia versus viral syndrome.  Low suspicion for PE with no tachypnea, tachycardia, or hypoxia but patient did have a long trip from [place] 4 weeks  ago. ? ? ?Plan: EKG, troponin x2, D-dimer, chest x-ray, COVID and flu swab, CBC, BMP, BNP.  We will monitor patient on telemetry. ? ? ?MEDICATIONS GIVEN IN ED: ?Medications - No data to display ? ? ?ED COURSE: COVID and flu negative.  Chest x-ray with no signs of pneumonia, pneumothorax or edema.  BNP is negative.  Troponin x2 negative.  EKG with no signs of ischemia.  Normal CBC and metabolic panel.  D-dimer negative.  Patient remains with no further episodes of shortness of breath.  No indication for admission.  Recommended close follow-up with PCP and discussed my standard return precautions. ? ? ?Consults: None ? ? ?EMR reviewed including her last visit with her PCP from November 2022 for an annual physical ? ? ? ?  FINAL CLINICAL IMPRESSION(S) / ED DIAGNOSES  ? ?Final diagnoses:  ?SOB (shortness of breath)  ? ? ? ?Rx / DC Orders  ? ?ED Discharge Orders   ? ? None  ? ?  ? ? ? ?Note:  This document was prepared using Dragon voice recognition software and may include unintentional dictation errors. ? ? ?Please note:  Patient was evaluated in Emergency Department today for the symptoms described in the history of present illness. Patient was evaluated in the context of the global COVID-19 pandemic, which necessitated consideration that the patient might be at risk for infection with the SARS-CoV-2 virus that causes COVID-19. Institutional protocols and algorithms that pertain to the evaluation of patients at risk for COVID-19 are in a state of rapid change based on information released by regulatory bodies including the CDC and federal and state organizations. These policies and algorithms were followed during the patient's care in the ED.  Some ED evaluations and interventions may be delayed as a result of limited staffing during the pandemic. ? ? ? ? ?  ?Rudene Re, MD ?08/23/21 [phone] ? ?

## 2021-08-23 NOTE — ED Notes (Signed)
Interpreter [phone] used:  Pt states SHOB began 2000 last night while she was cleaning, denies dizziness.  Endorses cough w/runny nose, chills in waiting room, but not at home. CP/discomfort of 1 or 2 at this time, pt reports that she drank half a can of soda and the earlier CP relieved. Pt is afebrile.   ?

## 2021-11-10 ENCOUNTER — Other Ambulatory Visit: Payer: Self-pay | Admitting: Internal Medicine

## 2021-11-10 DIAGNOSIS — Z1231 Encounter for screening mammogram for malignant neoplasm of breast: Secondary | ICD-10-CM

## 2022-06-25 DIAGNOSIS — Z1211 Encounter for screening for malignant neoplasm of colon: Secondary | ICD-10-CM | POA: Diagnosis not present

## 2022-06-25 DIAGNOSIS — Z1331 Encounter for screening for depression: Secondary | ICD-10-CM | POA: Diagnosis not present

## 2022-06-25 DIAGNOSIS — Z0000 Encounter for general adult medical examination without abnormal findings: Secondary | ICD-10-CM | POA: Diagnosis not present

## 2022-06-25 DIAGNOSIS — Z23 Encounter for immunization: Secondary | ICD-10-CM | POA: Diagnosis not present

## 2022-06-25 DIAGNOSIS — R7989 Other specified abnormal findings of blood chemistry: Secondary | ICD-10-CM | POA: Diagnosis not present

## 2022-06-25 DIAGNOSIS — J3089 Other allergic rhinitis: Secondary | ICD-10-CM | POA: Diagnosis not present

## 2022-06-26 DIAGNOSIS — Z0000 Encounter for general adult medical examination without abnormal findings: Secondary | ICD-10-CM | POA: Diagnosis not present

## 2022-06-26 DIAGNOSIS — J3089 Other allergic rhinitis: Secondary | ICD-10-CM | POA: Diagnosis not present

## 2022-06-26 DIAGNOSIS — R7989 Other specified abnormal findings of blood chemistry: Secondary | ICD-10-CM | POA: Diagnosis not present

## 2022-08-10 IMAGING — MG DIGITAL SCREENING BILAT W/ TOMO W/ CAD
8 series · 9 of 24 positions shown · non-contrast
Comparison: Previous exam(s).

CLINICAL DATA: Screening.

EXAM:
DIGITAL SCREENING BILATERAL MAMMOGRAM WITH TOMO AND CAD

[L CC synth-2D]
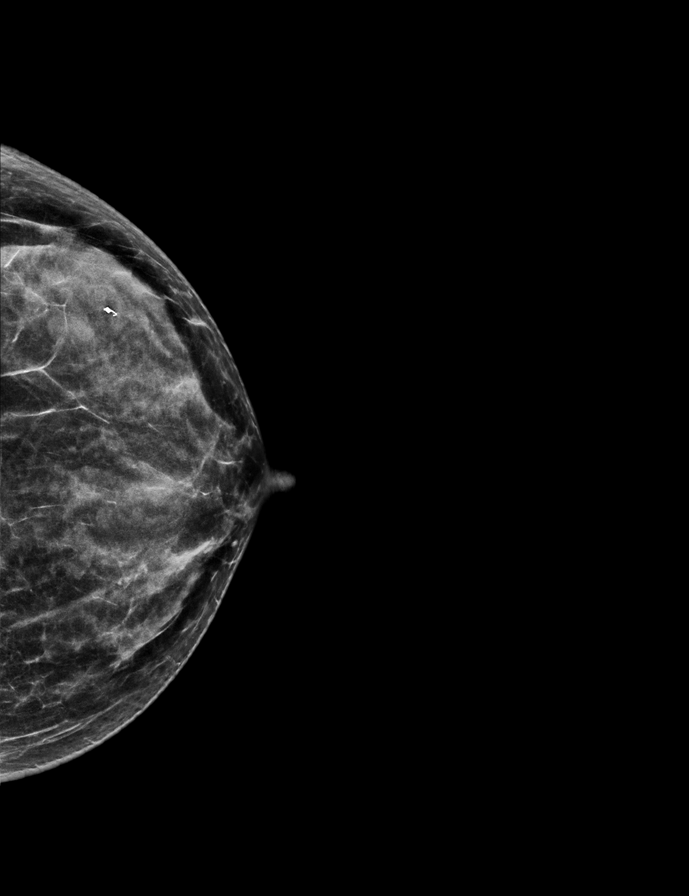

[R CC synth-2D]
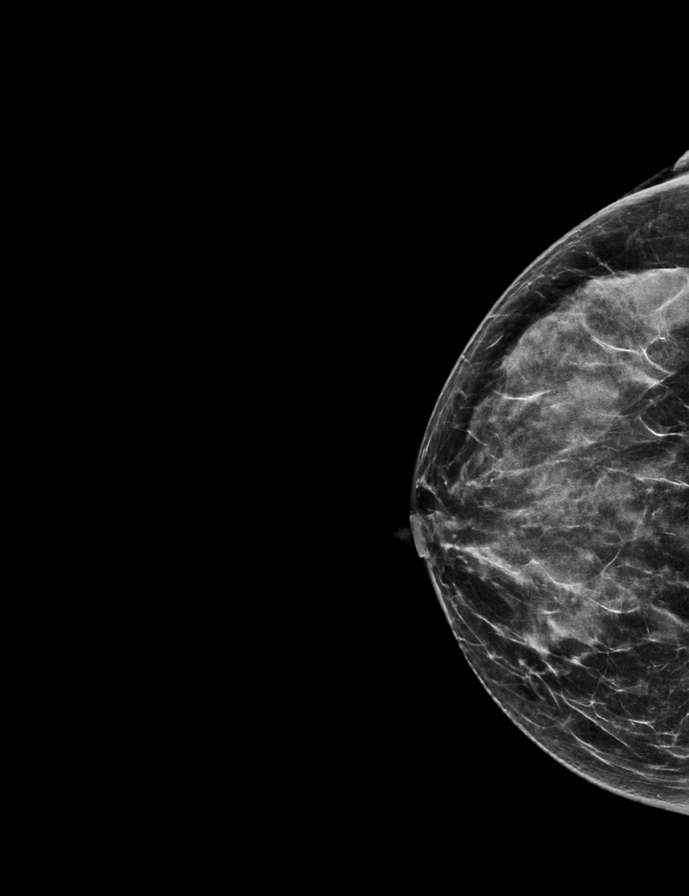

[L MLO synth-2D]
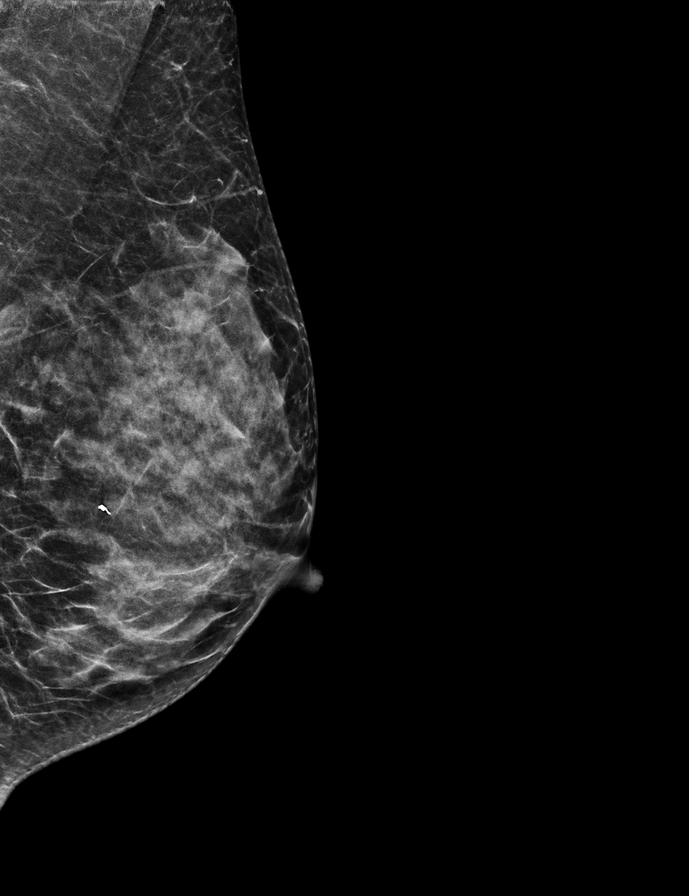

[R MLO synth-2D]
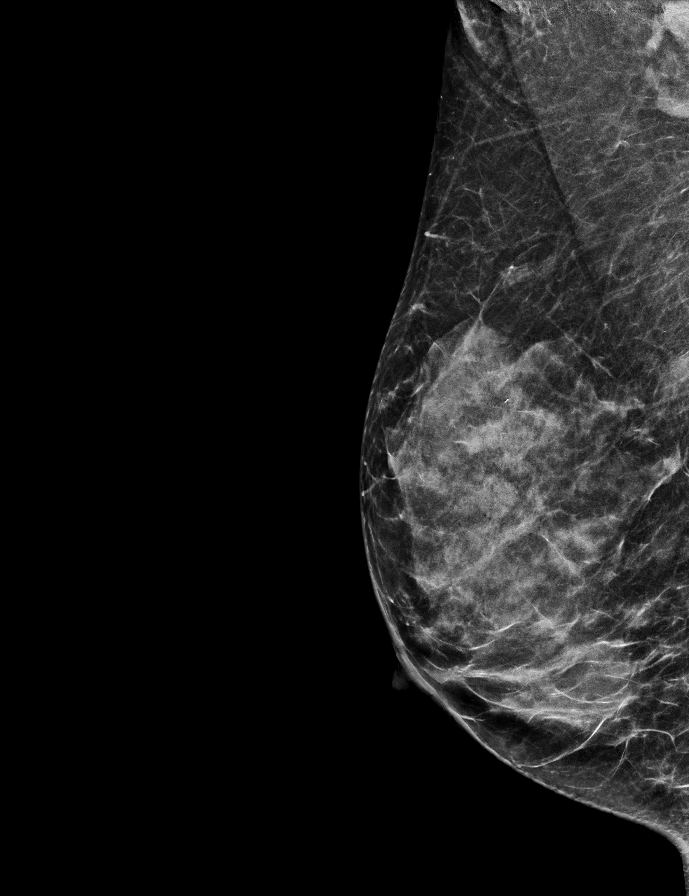

[R MLO tomo · 2 of 49 frames shown]
[frame 16/49]
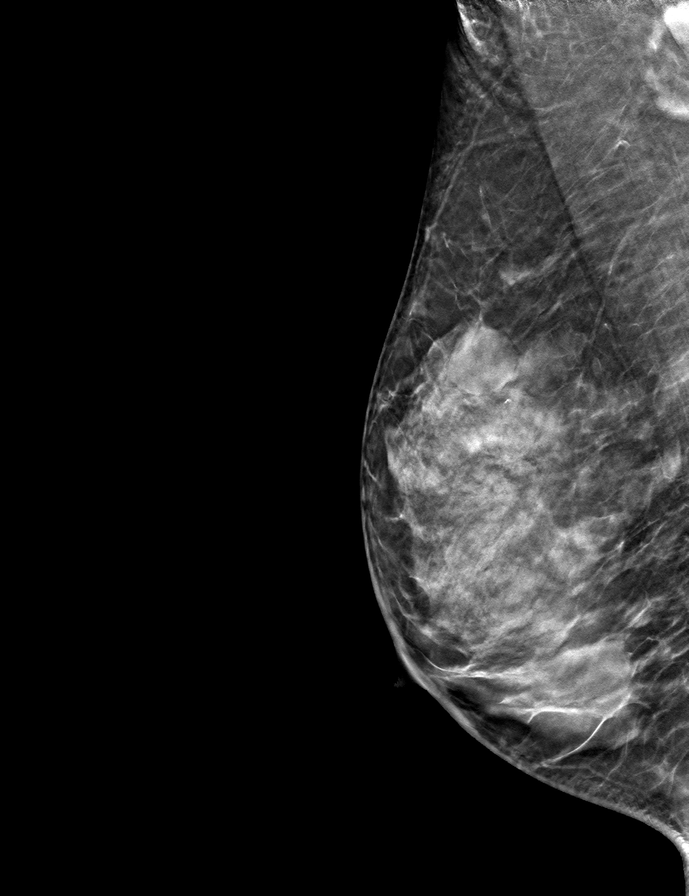
[frame 25/49]
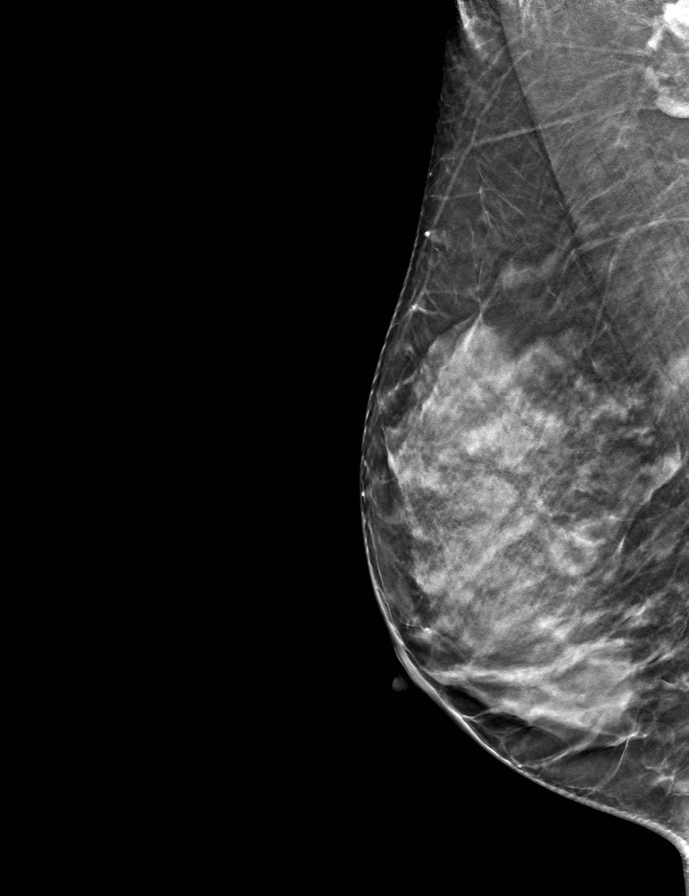

[L CC tomo · tomo slice 25/50.0]
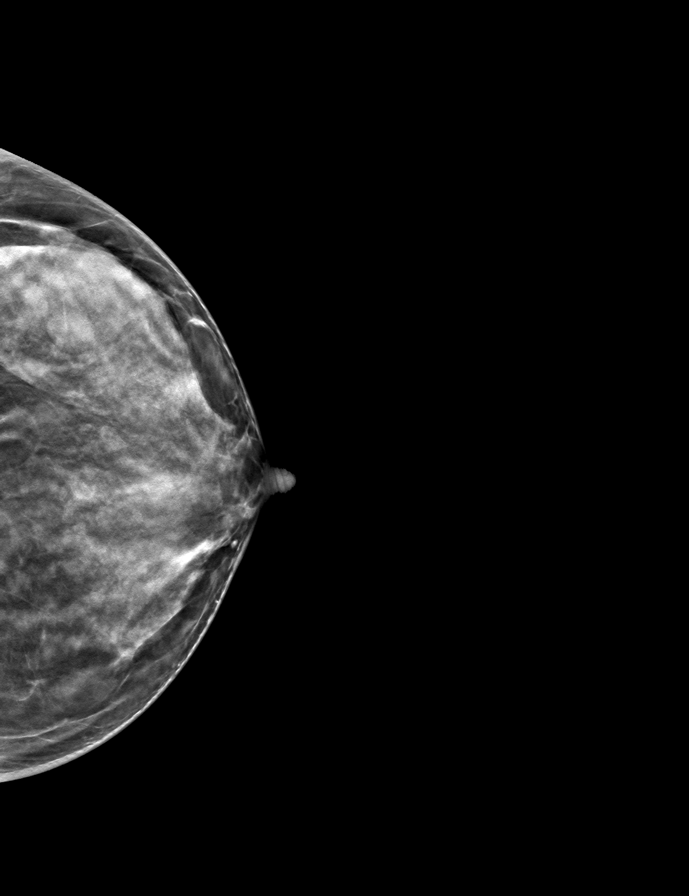

[L MLO tomo · tomo slice 23/46.0]
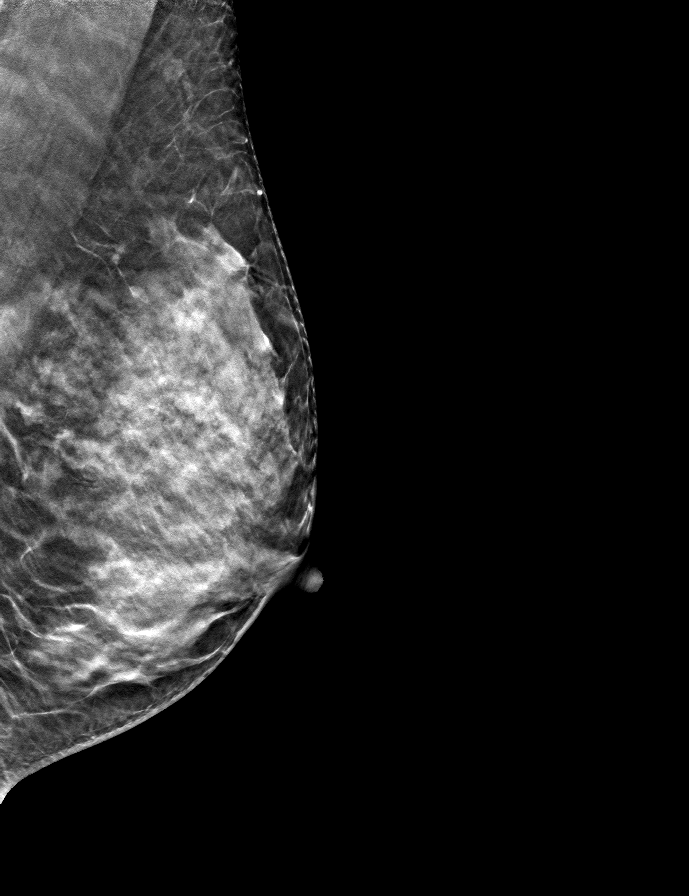

[R CC tomo · tomo slice 27/52.0]
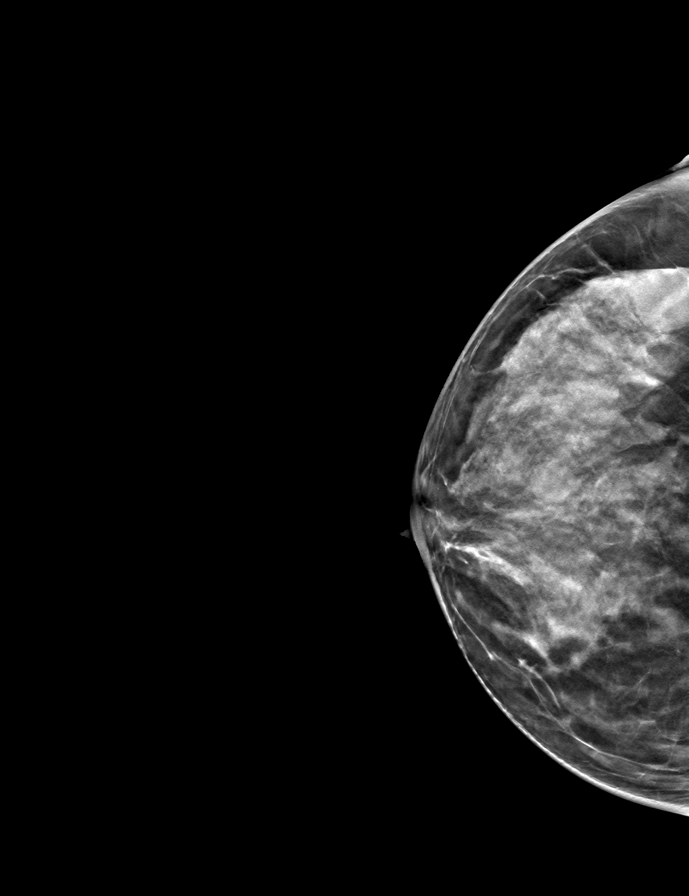

[9 of 24 positions shown; findings below may reference images not displayed]

ACR Breast Density Category d: The breast tissue is extremely dense,
which lowers the sensitivity of mammography
FINDINGS: There are no findings suspicious for malignancy. Images were
processed with CAD.
IMPRESSION: No mammographic evidence of malignancy. A result letter of this
screening mammogram will be mailed directly to the patient.

RECOMMENDATION:
Screening mammogram in one year. (Code:[id number])

BI-RADS CATEGORY  1: Negative.

## 2023-10-06 ENCOUNTER — Other Ambulatory Visit: Payer: Self-pay | Admitting: Internal Medicine

## 2023-10-06 DIAGNOSIS — Z1231 Encounter for screening mammogram for malignant neoplasm of breast: Secondary | ICD-10-CM

## 2023-10-11 ENCOUNTER — Other Ambulatory Visit: Payer: Self-pay | Admitting: Internal Medicine

## 2023-10-11 DIAGNOSIS — Z1231 Encounter for screening mammogram for malignant neoplasm of breast: Secondary | ICD-10-CM

## 2023-10-20 IMAGING — CR DG CHEST 2V
1 series · 2 of 2 positions shown · non-contrast
Comparison: None.

CLINICAL DATA: Acute onset shortness of breath and chills for 1 day

EXAM:
CHEST - 2 VIEW

[Series 1: dg chest 2 view · 0.14mm/px · 2 of 2 slices shown]
[im 1/2]
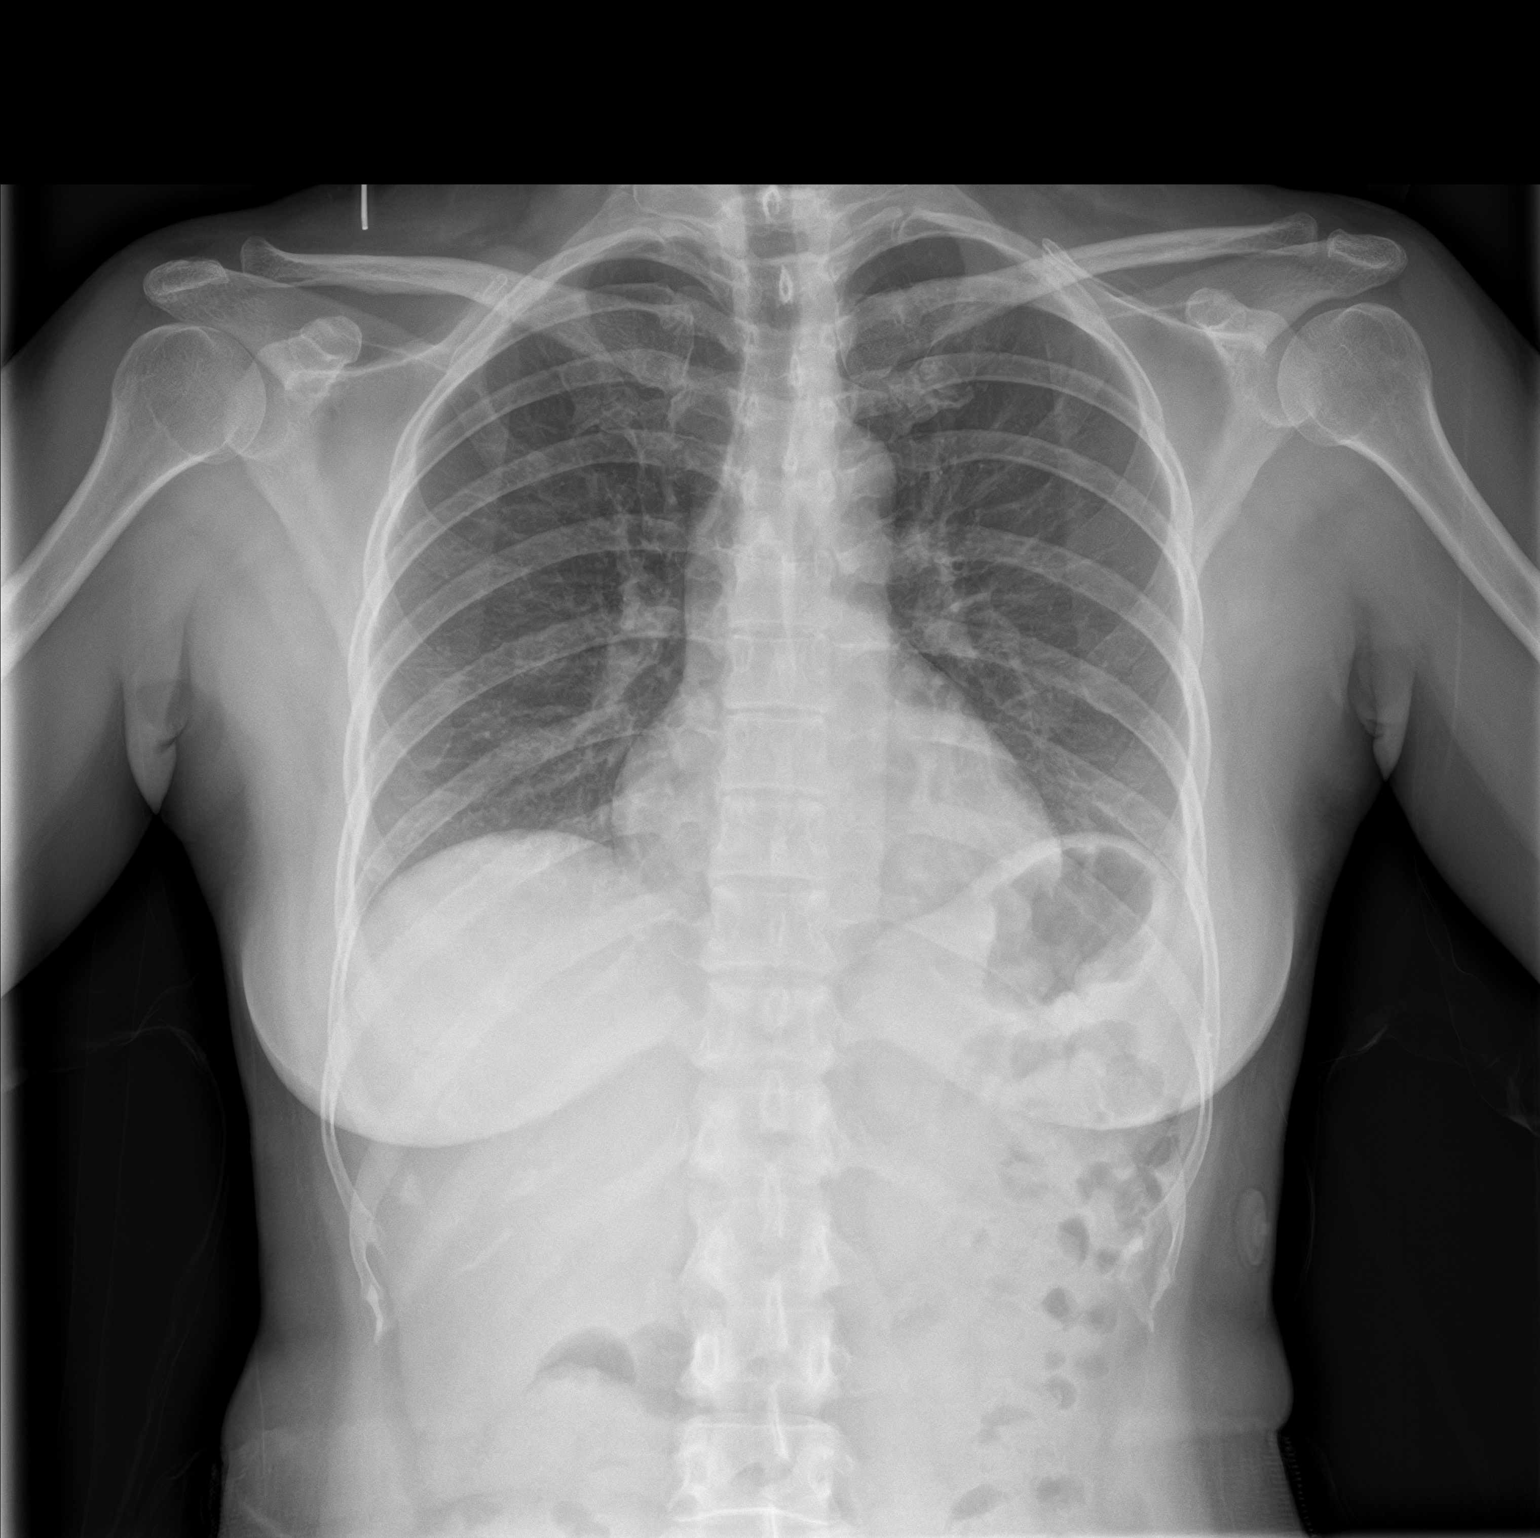
[im 2/2]
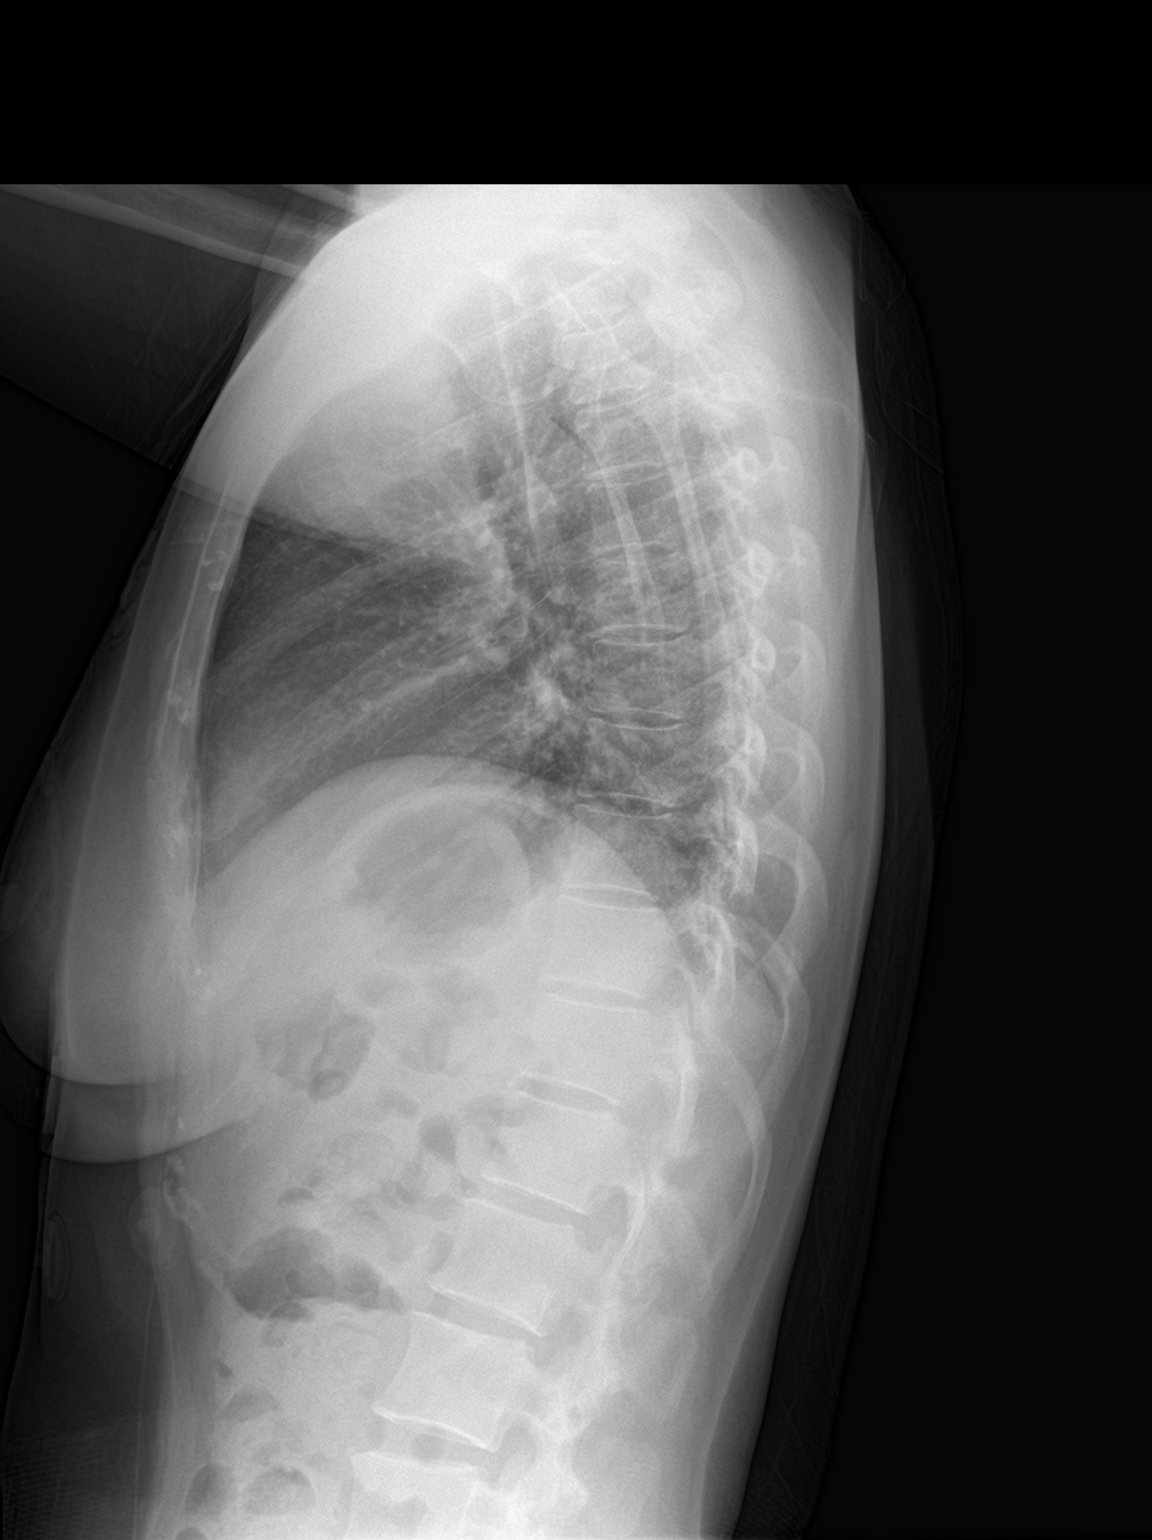

[2 of 2 positions shown; findings below may reference images not displayed]

FINDINGS: Frontal and lateral views of the chest demonstrate an unremarkable
cardiac silhouette. No airspace disease, effusion, or pneumothorax.
No acute bony abnormalities.
IMPRESSION: 1. No acute intrathoracic process.

## 2023-10-25 ENCOUNTER — Ambulatory Visit
Admission: RE | Admit: 2023-10-25 | Discharge: 2023-10-25 | Disposition: A | Discharge status: Home / Self Care | Payer: Self-pay | Source: Ambulatory Visit | Attending: Internal Medicine | Admitting: Internal Medicine

## 2023-10-25 DIAGNOSIS — Z1231 Encounter for screening mammogram for malignant neoplasm of breast: Secondary | ICD-10-CM | POA: Diagnosis present

## 2024-02-04 ENCOUNTER — Other Ambulatory Visit: Payer: Self-pay | Admitting: Internal Medicine

## 2024-02-04 DIAGNOSIS — M79605 Pain in left leg: Secondary | ICD-10-CM

## 2024-02-08 ENCOUNTER — Ambulatory Visit
Admission: RE | Admit: 2024-02-08 | Discharge: 2024-02-08 | Disposition: A | Discharge status: Home / Self Care | Source: Ambulatory Visit | Attending: Internal Medicine | Admitting: Internal Medicine

## 2024-02-08 DIAGNOSIS — M79605 Pain in left leg: Secondary | ICD-10-CM | POA: Diagnosis present

## 2024-04-25 ENCOUNTER — Other Ambulatory Visit: Payer: Self-pay | Admitting: Internal Medicine

## 2024-04-25 DIAGNOSIS — R590 Localized enlarged lymph nodes: Secondary | ICD-10-CM

## 2024-05-08 ENCOUNTER — Encounter
Admission: RE | Disposition: A | Discharge status: Home / Self Care | Payer: Self-pay | Source: Home / Self Care | Attending: Gastroenterology

## 2024-05-08 ENCOUNTER — Encounter: Payer: Self-pay | Admitting: Gastroenterology

## 2024-05-08 ENCOUNTER — Ambulatory Visit
Admission: RE | Admit: 2024-05-08 | Discharge: 2024-05-08 | Disposition: A | Discharge status: Home / Self Care | Attending: Gastroenterology | Admitting: Gastroenterology

## 2024-05-08 ENCOUNTER — Ambulatory Visit: Admitting: Anesthesiology

## 2024-05-08 DIAGNOSIS — Z1211 Encounter for screening for malignant neoplasm of colon: Secondary | ICD-10-CM | POA: Insufficient documentation

## 2024-05-08 DIAGNOSIS — Z90710 Acquired absence of both cervix and uterus: Secondary | ICD-10-CM | POA: Insufficient documentation

## 2024-05-08 DIAGNOSIS — K6289 Other specified diseases of anus and rectum: Secondary | ICD-10-CM | POA: Diagnosis not present

## 2024-05-08 HISTORY — PX: COLONOSCOPY: SHX5424

## 2024-05-08 SURGERY — COLONOSCOPY
Anesthesia: General

## 2024-05-08 MED ORDER — EPHEDRINE 5 MG/ML INJ
INTRAVENOUS | Status: AC
  Filled 2024-05-08: qty 5

## 2024-05-08 MED ORDER — PROPOFOL 500 MG/50ML IV EMUL
INTRAVENOUS | Status: DC | PRN
Start: 2024-05-08 — End: 2024-05-08
  Administered 2024-05-08: 150 ug/kg/min via INTRAVENOUS

## 2024-05-08 MED ORDER — SODIUM CHLORIDE 0.9 % IV SOLN
INTRAVENOUS | Status: DC
  Administered 2024-05-08: 500 mL via INTRAVENOUS

## 2024-05-08 MED ORDER — EPHEDRINE SULFATE-NACL 50-0.9 MG/10ML-% IV SOSY
PREFILLED_SYRINGE | INTRAVENOUS | Status: DC | PRN
  Administered 2024-05-08: 5 mg via INTRAVENOUS
  Administered 2024-05-08: 10 mg via INTRAVENOUS

## 2024-05-08 MED ORDER — PROPOFOL 1000 MG/100ML IV EMUL
INTRAVENOUS | Status: AC
  Filled 2024-05-08: qty 100

## 2024-05-08 MED ORDER — PROPOFOL 10 MG/ML IV BOLUS
INTRAVENOUS | Status: DC | PRN
  Administered 2024-05-08: 100 mg via INTRAVENOUS

## 2024-05-08 NOTE — Anesthesia Postprocedure Evaluation (Signed)
 Anesthesia Post Note  Patient: Stephanie Goodman  Procedure(s) Performed: COLONOSCOPY  Patient location during evaluation: PACU Anesthesia Type: General Level of consciousness: awake and alert Pain management: pain level controlled Vital Signs Assessment: post-procedure vital signs reviewed and stable Respiratory status: spontaneous breathing, nonlabored ventilation, respiratory function stable and patient connected to nasal cannula oxygen Cardiovascular status: blood pressure returned to baseline and stable Postop Assessment: no apparent nausea or vomiting Anesthetic complications: no   There were no known notable events for this encounter.   Last Vitals:  Vitals:   05/08/24 1140 05/08/24 1150  BP: 97/68 105/69  Pulse: 94 80  Resp: 17 17  Temp: (!) 36.1 C   SpO2: 98% 100%    Last Pain:  Vitals:   05/08/24 1150  TempSrc:   PainSc: 0-No pain                 Shau-Shau Melia

## 2024-05-08 NOTE — Transfer of Care (Signed)
 Immediate Anesthesia Transfer of Care Note  Patient: Stephanie Goodman  Procedure(s) Performed: COLONOSCOPY  Patient Location: PACU  Anesthesia Type:General  Level of Consciousness: awake and sedated  Airway & Oxygen Therapy: Patient Spontanous Breathing and Patient connected to nasal cannula oxygen  Post-op Assessment: Report given to RN and Post -op Vital signs reviewed and stable  Post vital signs: Reviewed and stable  Last Vitals:  Vitals Value Taken Time  BP    Temp    Pulse    Resp    SpO2      Last Pain:  Vitals:   05/08/24 1047  TempSrc: Temporal  PainSc: 0-No pain         Complications: There were no known notable events for this encounter.

## 2024-05-08 NOTE — Interval H&P Note (Signed)
 History and Physical Interval Note: Preprocedure H&P from 05/08/24  was reviewed and there was no interval change after seeing and examining the patient.  Written consent was obtained from the patient after discussion of risks, benefits, and alternatives. Patient has consented to proceed with Colonoscopy with possible intervention   05/08/2024 11:08 AM  Natanya Heuer  has presented today for surgery, with the diagnosis of Colon cancer screening.  The various methods of treatment have been discussed with the patient and family. After consideration of risks, benefits and other options for treatment, the patient has consented to  Procedure(s) with comments: COLONOSCOPY (N/A) - [place] INTERPRETER NEEDED as a surgical intervention.  The patient's history has been reviewed, patient examined, no change in status, stable for surgery.  I have reviewed the patient's chart and labs.  Questions were answered to the patient's satisfaction.     Elspeth Ozell Jungling

## 2024-05-08 NOTE — Op Note (Signed)
 [facility]astroenterology Patient Name: Stephanie Goodman Procedure Date: 05/08/2024 11:07 AM MRN: [id number] Account #: [id number] Date of Birth: [birth year] Admit Type: Outpatient Age: 53 Room: [facility] ENDO ROOM 2 Gender: Female Note Status: Finalized Instrument Name: Peds Colonoscope [id number] Procedure:             Colonoscopy Indications:           Screening for colorectal malignant neoplasm Providers:             Elspeth Ozell Jungling DO, DO Referring MD:          Tamra Leventhal, MD (Referring MD) Medicines:             Monitored Anesthesia Care Complications:         No immediate complications. Procedure:             Pre-Anesthesia Assessment:                        - Prior to the procedure, a History and Physical was                         performed, and patient medications and allergies were                         reviewed. The patient is competent. The risks and                         benefits of the procedure and the sedation options and                         risks were discussed with the patient. All questions                         were answered and informed consent was obtained.                         Patient identification and proposed procedure were                         verified by the physician, the nurse, the anesthetist                         and the technician in the endoscopy suite. Mental                         Status Examination: alert and oriented. Airway                         Examination: normal oropharyngeal airway and neck                         mobility. Respiratory Examination: clear to                         auscultation. CV Examination: RRR, no murmurs, no S3                         or S4. Prophylactic Antibiotics: The patient does not  require prophylactic antibiotics. Prior                         Anticoagulants: The patient has taken no anticoagulant                         or antiplatelet agents. ASA  Grade Assessment: II - A                         patient with mild systemic disease. After reviewing                         the risks and benefits, the patient was deemed in                         satisfactory condition to undergo the procedure. The                         anesthesia plan was to use monitored anesthesia care                         (MAC). Immediately prior to administration of                         medications, the patient was re-assessed for adequacy                         to receive sedatives. The heart rate, respiratory                         rate, oxygen saturations, blood pressure, adequacy of                         pulmonary ventilation, and response to care were                         monitored throughout the procedure. The physical                         status of the patient was re-assessed after the                         procedure.                        After obtaining informed consent, the colonoscope was                         passed under direct vision. Throughout the procedure,                         the patient's blood pressure, pulse, and oxygen                         saturations were monitored continuously. The                         Colonoscope was introduced through the anus and  advanced to the the terminal ileum, with                         identification of the appendiceal orifice and IC                         valve. The colonoscopy was performed without                         difficulty. The patient tolerated the procedure well.                         The quality of the bowel preparation was evaluated                         using the BBPS [facility] Bowel Preparation Scale) with                         scores of: Right Colon = 3 (entire mucosa seen well                         with no residual staining, small fragments of stool or                         opaque liquid), Transverse Colon = 3 (entire mucosa                          seen well with no residual staining, small fragments                         of stool or opaque liquid) and Left Colon = 2 (minor                         amount of residual staining, small fragments of stool                         and/or opaque liquid, but mucosa seen well). The total                         BBPS score equals 8. The quality of the bowel                         preparation was excellent. The terminal ileum,                         ileocecal valve, appendiceal orifice, and rectum were                         photographed. Findings:      The perianal and digital rectal examinations were normal. Pertinent       negatives include normal sphincter tone.      The terminal ileum appeared normal. Estimated blood loss: none.      Retroflexion in the right colon was performed.      Anal papilla(e) were hypertrophied. Estimated blood loss: none.      The entire examined colon appeared normal on direct and retroflexion  views. Impression:            - The examined portion of the ileum was normal.                        - Anal papilla(e) were hypertrophied.                        - The entire examined colon is normal on direct and                         retroflexion views.                        - No specimens collected. Recommendation:        - Patient has a contact number available for                         emergencies. The signs and symptoms of potential                         delayed complications were discussed with the patient.                         Return to normal activities tomorrow. Written                         discharge instructions were provided to the patient.                        - Discharge patient to home.                        - Resume previous diet.                        - Continue present medications.                        - Repeat colonoscopy in 10 years for screening                         purposes.                         - Return to referring physician as previously                         scheduled.                        - The findings and recommendations were discussed with                         the patient.                        - The findings and recommendations were discussed with                         the patient's family. Procedure Code(s):     --- Professional ---  54621, Colonoscopy, flexible; diagnostic, including                         collection of specimen(s) by brushing or washing, when                         performed (separate procedure) Diagnosis Code(s):     --- Professional ---                        Z12.11, Encounter for screening for malignant neoplasm                         of colon                        K62.89, Other specified diseases of anus and rectum CPT copyright 2022 American Medical Association. All rights reserved. The codes documented in this report are preliminary and upon coder review may  be revised to meet current compliance requirements. Attending Participation:      I personally performed the entire procedure. Elspeth Jungling, DO Elspeth Ozell Jungling DO, DO 05/08/2024 11:38:06 AM This report has been signed electronically. Number of Addenda: 0 Note Initiated On: 05/08/2024 11:07 AM Scope Withdrawal Time: 0 hours 8 minutes 49 seconds  Total Procedure Duration: 0 hours 14 minutes 29 seconds  Estimated Blood Loss:  Estimated blood loss: none.      [facility]

## 2024-05-08 NOTE — H&P (Signed)
 Pre-Procedure H&P   Patient ID: Stephanie Goodman is a 53 y.o. female.  Gastroenterology Provider: Elspeth Ozell Jungling, DO  Referring Provider: Jonette Primmer, PA PCP: Sadie Manna, MD  Date: 05/08/2024  HPI Stephanie Goodman is a 53 y.o. female who presents today for Colonoscopy for colorectal cancer screening .  Pt is native Vietnamese speaker. Information is garnered via chart review and video medical interpreter Stephanie Goodman  Bowels moving regularly. No melena/hematochezia  S/p hysterectomy   Past Medical History:  Diagnosis Date   Abdominal pain    Abnormal uterine bleeding    Anemia    Anxiety    Environmental and seasonal allergies    Urinary frequency     Past Surgical History:  Procedure Laterality Date   BREAST BIOPSY Left 10/29/2015   FOAMY MACROPHAGES, ACUTE AND CHRONIC DUCTAL INFLAMMATION, AND REACTIVE    LAPAROSCOPIC BILATERAL SALPINGECTOMY Bilateral 06/03/2018   Procedure: LAPAROSCOPIC BILATERAL SALPINGECTOMY;  Surgeon: Ward, Mitzie BROCKS, MD;  Location: [facility];  Service: Gynecology;  Laterality: Bilateral;   LAPAROSCOPIC HYSTERECTOMY N/A 06/03/2018   Procedure: HYSTERECTOMY TOTAL LAPAROSCOPIC;  Surgeon: Ward, Mitzie BROCKS, MD;  Location: [facility];  Service: Gynecology;  Laterality: N/A;    Family History No h/o GI disease or malignancy  Review of Systems  Constitutional:  Negative for activity change, appetite change, chills, diaphoresis, fatigue, fever and unexpected weight change.  HENT:  Negative for trouble swallowing and voice change.   Respiratory:  Negative for shortness of breath and wheezing.   Cardiovascular:  Negative for chest pain, palpitations and leg swelling.  Gastrointestinal:  Negative for abdominal distention, abdominal pain, anal bleeding, blood in stool, constipation, diarrhea, nausea, rectal pain and vomiting.  Musculoskeletal:  Negative for arthralgias and myalgias.  Skin:  Negative for color change and pallor.  Neurological:  Negative  for dizziness, syncope and weakness.  Psychiatric/Behavioral:  Negative for confusion.   All other systems reviewed and are negative.    Medications No current facility-administered medications on file prior to encounter.   Current Outpatient Medications on File Prior to Encounter  Medication Sig Dispense Refill   levocetirizine (XYZAL) 5 MG tablet Take 5 mg by mouth at bedtime as needed.   4   ibuprofen  (ADVIL ,MOTRIN ) 800 MG tablet Take 1 tablet (800 mg total) by mouth every 6 (six) hours. 45 tablet 1    Pertinent medications related to GI and procedure were reviewed by me with the patient prior to the procedure   Current Facility-Administered Medications:    0.9 %  sodium chloride infusion, , Intravenous, Continuous, Jungling Elspeth Ozell, DO, Last Rate: 20 mL/hr at 05/08/24 1049, 500 mL at 05/08/24 1049  sodium chloride 500 mL (05/08/24 1049)       No Known Allergies Allergies were reviewed by me prior to the procedure  Objective   Body mass index is 24.57 kg/m. Vitals:   05/08/24 1047  BP: 123/76  Pulse: 65  Resp: 16  Temp: (!) 97.2 F (36.2 C)  TempSrc: Temporal  SpO2: 100%  Weight: 57.1 kg  Height: 5' (1.524 m)     Physical Exam Vitals and nursing note reviewed.  Constitutional:      General: She is not in acute distress.    Appearance: Normal appearance. She is not ill-appearing, toxic-appearing or diaphoretic.  HENT:     Head: Normocephalic and atraumatic.     Nose: Nose normal.     Mouth/Throat:     Mouth: Mucous membranes are moist.  Pharynx: Oropharynx is clear.  Eyes:     General: No scleral icterus.    Extraocular Movements: Extraocular movements intact.  Cardiovascular:     Rate and Rhythm: Normal rate and regular rhythm.     Heart sounds: Normal heart sounds. No murmur heard.    No friction rub. No gallop.  Pulmonary:     Effort: Pulmonary effort is normal. No respiratory distress.     Breath sounds: Normal breath sounds. No wheezing,  rhonchi or rales.  Abdominal:     General: Bowel sounds are normal. There is no distension.     Palpations: Abdomen is soft.     Tenderness: There is no abdominal tenderness. There is no guarding or rebound.  Musculoskeletal:     Cervical back: Neck supple.     Right lower leg: No edema.     Left lower leg: No edema.  Skin:    General: Skin is warm and dry.     Coloration: Skin is not jaundiced or pale.  Neurological:     General: No focal deficit present.     Mental Status: She is alert and oriented to person, place, and time. Mental status is at baseline.  Psychiatric:        Mood and Affect: Mood normal.        Behavior: Behavior normal.        Thought Content: Thought content normal.        Judgment: Judgment normal.      Assessment:  Stephanie Goodman is a 53 y.o. female  who presents today for Colonoscopy for colorectal cancer screening .  Plan:  Colonoscopy with possible intervention today  Colonoscopy with possible biopsy, control of bleeding, polypectomy, and interventions as necessary has been discussed with the patient/patient representative. Informed consent was obtained from the patient/patient representative after explaining the indication, nature, and risks of the procedure including but not limited to death, bleeding, perforation, missed neoplasm/lesions, cardiorespiratory compromise, and reaction to medications. Opportunity for questions was given and appropriate answers were provided. Patient/patient representative has verbalized understanding is amenable to undergoing the procedure.   Elspeth Ozell Jungling, DO  [facility]  Portions of the record may have been created with voice recognition software. Occasional wrong-word or 'sound-a-like' substitutions may have occurred due to the inherent limitations of voice recognition software.  Read the chart carefully and recognize, using context, where substitutions may have occurred.

## 2024-05-08 NOTE — Care Plan (Signed)
 Brief GI Post op note  See colonoscopy report for details Pt's son called Brigido Daniels) and informed of patient's normal colonoscopy.  He verbalized understanding and had no further questions.  Elspeth EMERSON Jungling, DO [facility]

## 2024-05-08 NOTE — Anesthesia Preprocedure Evaluation (Addendum)
 Anesthesia Evaluation  Patient identified by MRN, date of birth, ID band Patient awake    Reviewed: Allergy & Precautions, NPO status , Patient's Chart, lab work & pertinent test results  History of Anesthesia Complications Negative for: history of anesthetic complications  Airway Mallampati: III  TM Distance: >3 FB Neck ROM: full    Dental no notable dental hx.    Pulmonary neg pulmonary ROS   Pulmonary exam normal        Cardiovascular negative cardio ROS Normal cardiovascular exam     Neuro/Psych  PSYCHIATRIC DISORDERS Anxiety     negative neurological ROS     GI/Hepatic negative GI ROS, Neg liver ROS,,,  Endo/Other  negative endocrine ROS    Renal/GU negative Renal ROS  negative genitourinary   Musculoskeletal   Abdominal   Peds  Hematology  (+) Blood dyscrasia, anemia   Anesthesia Other Findings Past Medical History: No date: Abdominal pain No date: Abnormal uterine bleeding No date: Anemia No date: Anxiety No date: Environmental and seasonal allergies No date: Urinary frequency  Past Surgical History: 10/29/2015: BREAST BIOPSY; Left     Comment:  FOAMY MACROPHAGES, ACUTE AND CHRONIC DUCTAL               INFLAMMATION, AND REACTIVE  06/03/2018: LAPAROSCOPIC BILATERAL SALPINGECTOMY; Bilateral     Comment:  Procedure: LAPAROSCOPIC BILATERAL SALPINGECTOMY;                Surgeon: Ward, Mitzie BROCKS, MD;  Location: [facility];                Service: Gynecology;  Laterality: Bilateral; 06/03/2018: LAPAROSCOPIC HYSTERECTOMY; N/A     Comment:  Procedure: HYSTERECTOMY TOTAL LAPAROSCOPIC;  Surgeon:               Ward, Mitzie BROCKS, MD;  Location: [facility];  Service:               Gynecology;  Laterality: N/A;     Reproductive/Obstetrics negative OB ROS                              Anesthesia Physical Anesthesia Plan  ASA: 2  Anesthesia Plan: General   Post-op Pain Management:  Minimal or no pain anticipated   Induction: Intravenous  PONV Risk Score and Plan: 2 and Propofol  infusion and TIVA  Airway Management Planned: Natural Airway and Nasal Cannula  Additional Equipment:   Intra-op Plan:   Post-operative Plan:   Informed Consent: I have reviewed the patients History and Physical, chart, labs and discussed the procedure including the risks, benefits and alternatives for the proposed anesthesia with the patient or authorized representative who has indicated his/her understanding and acceptance.     Dental Advisory Given and Interpreter used for interview  Plan Discussed with: Anesthesiologist, CRNA and Surgeon  Anesthesia Plan Comments: (Patient consented for risks of anesthesia including but not limited to:  - adverse reactions to medications - risk of airway placement if required - damage to eyes, teeth, lips or other oral mucosa - nerve damage due to positioning  - sore throat or hoarseness - Damage to heart, brain, nerves, lungs, other parts of body or loss of life  Patient voiced understanding and assent.)         Anesthesia Quick Evaluation
